# Patient Record
Sex: Female | Born: 1985 | Race: Black or African American | Hispanic: No | Marital: Single | Smoking: Never smoker
Health system: Southern US, Community
[De-identification: ages and names within clinical notes are randomized; demographics above are authoritative.]

## PROBLEM LIST (undated history)

## (undated) DIAGNOSIS — E079 Disorder of thyroid, unspecified: Secondary | ICD-10-CM

## (undated) DIAGNOSIS — Z8719 Personal history of other diseases of the digestive system: Secondary | ICD-10-CM

## (undated) DIAGNOSIS — D649 Anemia, unspecified: Secondary | ICD-10-CM

## (undated) DIAGNOSIS — I209 Angina pectoris, unspecified: Secondary | ICD-10-CM

## (undated) DIAGNOSIS — E039 Hypothyroidism, unspecified: Secondary | ICD-10-CM

## (undated) HISTORY — DX: Disorder of thyroid, unspecified: E07.9

## (undated) HISTORY — PX: NO PAST SURGERIES: SHX2092

## (undated) SURGERY — Surgical Case
Anesthesia: *Unknown

---

## 2015-07-15 ENCOUNTER — Other Ambulatory Visit (HOSPITAL_COMMUNITY)
Admission: RE | Admit: 2015-07-15 | Discharge: 2015-07-15 | Disposition: A | Discharge status: Home / Self Care | Payer: BC Managed Care – PPO | Source: Ambulatory Visit | Attending: Nurse Practitioner | Admitting: Nurse Practitioner

## 2015-07-15 DIAGNOSIS — Z113 Encounter for screening for infections with a predominantly sexual mode of transmission: Secondary | ICD-10-CM | POA: Diagnosis present

## 2015-07-15 DIAGNOSIS — Z01419 Encounter for gynecological examination (general) (routine) without abnormal findings: Secondary | ICD-10-CM | POA: Insufficient documentation

## 2017-05-24 ENCOUNTER — Other Ambulatory Visit (HOSPITAL_COMMUNITY): Payer: Self-pay | Admitting: General Surgery

## 2017-05-30 ENCOUNTER — Ambulatory Visit (HOSPITAL_COMMUNITY): Payer: BC Managed Care – PPO

## 2017-05-30 ENCOUNTER — Encounter: Payer: Self-pay | Admitting: Registered"

## 2017-05-30 ENCOUNTER — Encounter: Payer: BC Managed Care – PPO | Attending: General Surgery | Admitting: Registered"

## 2017-05-30 DIAGNOSIS — Z713 Dietary counseling and surveillance: Secondary | ICD-10-CM | POA: Insufficient documentation

## 2017-05-30 DIAGNOSIS — Z6841 Body Mass Index (BMI) 40.0 and over, adult: Secondary | ICD-10-CM | POA: Insufficient documentation

## 2017-05-30 DIAGNOSIS — E669 Obesity, unspecified: Secondary | ICD-10-CM

## 2017-05-30 NOTE — Progress Notes (Signed)
Pre-Op Assessment Visit:  Pre-Operative Sleeve Gastrectomy Surgery  Medical Nutrition Therapy:  Appt start time: 2:50  End time:  3:45  Patient was seen on 05/30/2017 for Pre-Operative Nutrition Assessment. Assessment and letter of approval [facility] coordinator on 05/30/2017.   Pt expectation of surgery: prevent health conditions common in her family such as diabetes & cardiovascular disease, more energy, find a way to eat healthier  Pt expectation of Dietitian: accountability  Start weight at [facility]: 274.8 BMI: 49.46   Pt states there are a lot of things she does not like: cauliflower, brussel sprouts, bananas, broccoli, and olives. Pt states she takes stairs while at work; used to attend [employer] but stopped due to it being expensive.   Per insurance, pt needs 0 SWL visits prior to surgery.    24 hr Dietary Recall: First Meal: sometimes skips; [employer]- cajun filet biscuit Snack: none Second Meal: chicken pot pie, green beans, apple pie, ice cream Snack: none Third Meal: skips Snack: sometimes peanut butter crunch bar Beverages: water, lemonade, sweet tea (occasionally), coffee (sometimes)  Encouraged to engage in 75 minutes of moderate physical activity including cardiovascular and weight baring weekly  Handouts given during visit include:  . Pre-Op Goals . Bariatric Surgery Protein Shakes . Vitamin and Mineral Recommendations  During the appointment today the following Pre-Op Goals were reviewed with the patient: . Maintain or lose weight as instructed by your surgeon  . Make healthy food choices . Begin to limit portion sizes . Limited concentrated sugars and fried foods . Keep fat/sugar in the single digits per serving on          food labels . Practice CHEWING your food  (aim for 30 chews per bite or until applesauce consistency) . Practice not drinking 15 minutes before, during, and 30 minutes after each  meal/snack . Avoid all carbonated beverages  . Avoid/limit caffeinated beverages  . Avoid all sugar-sweetened beverages . Consume 3 meals per day; eat every 3-5 hours . Make a list of non-food related activities . Aim for 64-100 ounces of FLUID daily  . Aim for at least 60-80 grams of PROTEIN daily . Look for a liquid protein source that contain ?15 g protein and ?5 g carbohydrate  (ex: shakes, drinks, shots) . Physical activity is an important part of a healthy lifestyle so keep it moving!  Follow diet recommendations listed below Energy and Macronutrient Recommendations: Calories: 1800 Carbohydrate: 200 Protein: 135 Fat: 50  Demonstrated degree of understanding via:  Teach Back   Teaching Method Utilized:  Visual Auditory Hands on  Barriers to learning/adherence to lifestyle change: none  Patient to call the [facility] to enroll in Pre-Op and Post-Op Nutrition Education when surgery date is scheduled.

## 2017-07-21 ENCOUNTER — Other Ambulatory Visit (HOSPITAL_COMMUNITY): Payer: BC Managed Care – PPO

## 2017-07-21 ENCOUNTER — Encounter (HOSPITAL_COMMUNITY): Payer: Self-pay

## 2017-07-21 ENCOUNTER — Ambulatory Visit (HOSPITAL_COMMUNITY): Payer: BC Managed Care – PPO

## 2017-07-27 ENCOUNTER — Ambulatory Visit (HOSPITAL_COMMUNITY)
Admission: RE | Admit: 2017-07-27 | Discharge: 2017-07-27 | Disposition: A | Discharge status: Home / Self Care | Payer: BC Managed Care – PPO | Source: Ambulatory Visit | Attending: General Surgery | Admitting: General Surgery

## 2017-07-27 ENCOUNTER — Encounter (HOSPITAL_COMMUNITY): Payer: Self-pay | Admitting: Radiology

## 2017-07-27 ENCOUNTER — Other Ambulatory Visit: Payer: Self-pay

## 2017-07-27 DIAGNOSIS — R9431 Abnormal electrocardiogram [ECG] [EKG]: Secondary | ICD-10-CM | POA: Diagnosis not present

## 2017-07-27 DIAGNOSIS — K449 Diaphragmatic hernia without obstruction or gangrene: Secondary | ICD-10-CM | POA: Diagnosis not present

## 2017-07-27 DIAGNOSIS — Z01810 Encounter for preprocedural cardiovascular examination: Secondary | ICD-10-CM | POA: Diagnosis not present

## 2017-07-27 DIAGNOSIS — K219 Gastro-esophageal reflux disease without esophagitis: Secondary | ICD-10-CM | POA: Diagnosis not present

## 2017-08-01 ENCOUNTER — Encounter: Payer: BC Managed Care – PPO | Attending: General Surgery | Admitting: Skilled Nursing Facility1

## 2017-08-01 DIAGNOSIS — Z713 Dietary counseling and surveillance: Secondary | ICD-10-CM | POA: Insufficient documentation

## 2017-08-01 DIAGNOSIS — Z6841 Body Mass Index (BMI) 40.0 and over, adult: Secondary | ICD-10-CM | POA: Diagnosis not present

## 2017-08-01 DIAGNOSIS — E669 Obesity, unspecified: Secondary | ICD-10-CM

## 2017-08-03 ENCOUNTER — Encounter: Payer: Self-pay | Admitting: Skilled Nursing Facility1

## 2017-08-03 NOTE — Progress Notes (Signed)
Pre-Operative Nutrition Class:  Appt start time: 5284   End time:  1830.  Patient was seen on 08/01/2017 for Pre-Operative Bariatric Surgery Education at [facility].   Surgery date:  Surgery type: sleeve Start weight at [facility]: 274.8 Weight today: 282.8  Samples given per MNT protocol. Patient educated on appropriate usage: Bariatric Advantage Multivitamin Lot # [id number] Exp: 01/2018  Bariatric Advantage Calcium  Lot # [id number] Exp:03-26-18  Unjury Protein Shake Lot # [id number] Exp: 04-30-18 The following the learning objectives were met by the patient during this course:  Identify Pre-Op Dietary Goals and will begin 2 weeks pre-operatively  Identify appropriate sources of fluids and proteins   State protein recommendations and appropriate sources pre and post-operatively  Identify Post-Operative Dietary Goals and will follow for 2 weeks post-operatively  Identify appropriate multivitamin and calcium sources  Describe the need for physical activity post-operatively and will follow MD recommendations  State when to call healthcare provider regarding medication questions or post-operative complications  Handouts given during class include:  Pre-Op Bariatric Surgery Diet Handout  Protein Shake Handout  Post-Op Bariatric Surgery Nutrition Handout  [facility] Program Information Flyer  Support Group Information Flyer  [facility] Bariatric Supplements Price List  Follow-Up Plan: [facility] diet advancement per MD.

## 2017-09-02 ENCOUNTER — Ambulatory Visit: Payer: Self-pay | Admitting: General Surgery

## 2017-09-07 NOTE — Patient Instructions (Addendum)
Lauren Oconnor Lauren Oconnor  09/07/2017   Your procedure is scheduled on: 09-13-17   [facility]     Report to admitting at 10:15 AM   Call this number if you have problems the morning of surgery [phone]     Remember: NO SOLID FOOD AFTER 600 PM THE NIGHT BEFORE YOUR SURGERY. YOU MAY DRINK CLEAR FLUIDS. MORNING OF SURGERY DRINK:  1SHAKE BEFORE YOU LEAVE HOME, DRINK ALL OF THE SHAKE AT ONE TIME.  THE SHAKE YOU DRINK BEFORE YOU LEAVE HOME WILL BE THE LAST FLUIDS YOU DRINK BEFORE SURGERY.   PAIN IS EXPECTED AFTER SURGERY AND WILL NOT BE COMPLETELY ELIMINATED. AMBULATION AND TYLENOL WILL HELP REDUCE INCISIONAL AND GAS PAIN. MOVEMENT IS KEY! YOU ARE EXPECTED TO BE OUT OF BED WITHIN 4 HOURS OF ADMISSION TO YOUR PATIENT ROOM. SITTING IN THE RECLINER THROUGHOUT THE DAY IS IMPORTANT FOR DRINKING FLUIDS AND MOVING GAS THROUGHOUT THE GI TRACT. COMPRESSION STOCKINGS SHOULD BE [facility] UNLESS YOU ARE WALKING.  INCENTIVE SPIROMETER SHOULD BE USED EVERY HOUR WHILE AWAKE TO DECREASE POST-OPERATIVE COMPLICATIONS SUCH AS PNEUMONIA. WHEN DISCHARGED HOME, IT IS IMPORTANT TO CONTINUE TO WALK EVERY HOUR AND USE THE INCENTIVE SPIROMETER EVERY HOUR.       CLEAR LIQUID DIET   Foods Allowed                                                                     Foods Excluded  Coffee and tea, regular and decaf                             liquids that you cannot  Plain Jell-O in any flavor                                             see through such as: Fruit ices (not with fruit pulp)                                     milk, soups, orange juice  Iced Popsicles                                    All solid food Carbonated beverages, regular and diet                                    Cranberry, grape and apple juices Sports drinks like Gatorade Lightly seasoned clear broth or consume(fat free) Sugar, honey syrup  Sample Menu Breakfast                                 Lunch  Supper Cranberry juice                    Beef broth                            Chicken broth Jell-O                                     Grape juice                           Apple juice Coffee or tea                        Jell-O                                      Popsicle                                                Coffee or tea                        Coffee or tea  _____________________________________________________________________         Take these medicines the morning of surgery with A SIP OF WATER: Levothyroxine (Synthroid)                                You may not have any metal on your body including hair pins and              piercings  Do not wear jewelry, make-up, lotions, powders or perfumes, deodorant             Do not wear nail polish.  Do not shave  48 hours prior to surgery.                Do not bring valuables to the hospital. [facility] IS NOT             RESPONSIBLE   FOR VALUABLES.  Contacts, dentures or bridgework may not be worn into surgery.  Leave suitcase in the car. After surgery it may be brought to your room.                  Please read over the following fact sheets you were given: _____________________________________________________________________          [facility] - Preparing for Surgery Before surgery, you can play an important role.  Because skin is not sterile, your skin needs to be as free of germs as possible.  You can reduce the number of germs on your skin by washing with CHG (chlorahexidine gluconate) soap before surgery.  CHG is an antiseptic cleaner which kills germs and bonds with the skin to continue killing germs even after washing. Please DO NOT use if you have an allergy to CHG or antibacterial soaps.  If your skin becomes reddened/irritated stop using the CHG and inform your nurse when you arrive at Short Stay. Do not shave (including legs and underarms)  for at least 48 hours prior to the first CHG shower.  You may shave your face/neck. Please follow these instructions carefully:  1.  Shower with CHG Soap the night before surgery and the  morning of Surgery.  2.  If you choose to wash your hair, wash your hair first as usual with your  normal  shampoo.  3.  After you shampoo, rinse your hair and body thoroughly to remove the  shampoo.                           4.  Use CHG as you would any other liquid soap.  You can apply chg directly  to the skin and wash                       Gently with a scrungie or clean washcloth.  5.  Apply the CHG Soap to your body ONLY FROM THE NECK DOWN.   Do not use on face/ open                           Wound or open sores. Avoid contact with eyes, ears mouth and genitals (private parts).                       Wash face,  Genitals (private parts) with your normal soap.             6.  Wash thoroughly, paying special attention to the area where your surgery  will be performed.  7.  Thoroughly rinse your body with warm water from the neck down.  8.  DO NOT shower/wash with your normal soap after using and rinsing off  the CHG Soap.                9.  Pat yourself dry with a clean towel.            10.  Wear clean pajamas.            11.  Place clean sheets on your bed the night of your first shower and do not  sleep with pets. Day of Surgery : Do not apply any lotions/deodorants the morning of surgery.  Please wear clean clothes to the hospital/surgery center.  FAILURE TO FOLLOW THESE INSTRUCTIONS MAY RESULT IN THE CANCELLATION OF YOUR SURGERY PATIENT SIGNATURE_________________________________  NURSE SIGNATURE__________________________________  ________________________________________________________________________  WHAT IS A BLOOD TRANSFUSION? Blood Transfusion Information  A transfusion is the replacement of blood or some of its parts. Blood is made up of multiple cells which provide different  functions.  Red blood cells carry oxygen and are used for blood loss replacement.  White blood cells fight against infection.  Platelets control bleeding.  Plasma helps clot blood.  Other blood products are available for specialized needs, such as hemophilia or other clotting disorders. BEFORE THE TRANSFUSION  Who gives blood for transfusions?   Healthy volunteers who are fully evaluated to make sure their blood is safe. This is blood bank blood. Transfusion therapy is the safest it has ever been in the practice of medicine. Before blood is taken from a donor, a complete history is taken to make sure that person has no history of diseases nor engages in risky social behavior (examples are intravenous drug use or sexual activity with multiple partners). The donor's travel history is screened to minimize risk of transmitting  infections, such as malaria. The donated blood is tested for signs of infectious diseases, such as HIV and hepatitis. The blood is then tested to be sure it is compatible with you in order to minimize the chance of a transfusion reaction. If you or a relative donates blood, this is often done in anticipation of surgery and is not appropriate for emergency situations. It takes many days to process the donated blood. RISKS AND COMPLICATIONS Although transfusion therapy is very safe and saves many lives, the main dangers of transfusion include:   Getting an infectious disease.  Developing a transfusion reaction. This is an allergic reaction to something in the blood you were given. Every precaution is taken to prevent this. The decision to have a blood transfusion has been considered carefully by your caregiver before blood is given. Blood is not given unless the benefits outweigh the risks. AFTER THE TRANSFUSION  Right after receiving a blood transfusion, you will usually feel much better and more energetic. This is especially true if your red blood cells have gotten low  (anemic). The transfusion raises the level of the red blood cells which carry oxygen, and this usually causes an energy increase.  The nurse administering the transfusion will monitor you carefully for complications. HOME CARE INSTRUCTIONS  No special instructions are needed after a transfusion. You may find your energy is better. Speak with your caregiver about any limitations on activity for underlying diseases you may have. SEEK MEDICAL CARE IF:   Your condition is not improving after your transfusion.  You develop redness or irritation at the intravenous (IV) site. SEEK IMMEDIATE MEDICAL CARE IF:  Any of the following symptoms occur over the next 12 hours:  Shaking chills.  You have a temperature by mouth above 102 F (38.9 C), not controlled by medicine.  Chest, back, or muscle pain.  People around you feel you are not acting correctly or are confused.  Shortness of breath or difficulty breathing.  Dizziness and fainting.  You get a rash or develop hives.  You have a decrease in urine output.  Your urine turns a dark color or changes to pink, red, or brown. Any of the following symptoms occur over the next 10 days:  You have a temperature by mouth above 102 F (38.9 C), not controlled by medicine.  Shortness of breath.  Weakness after normal activity.  The white part of the eye turns yellow (jaundice).  You have a decrease in the amount of urine or are urinating less often.  Your urine turns a dark color or changes to pink, red, or brown. Document Released: 07/02/2000 Document Revised: 09/27/2011 Document Reviewed: 02/19/2008 [facility] Patient Information 2014 [place], [place].  _______________________________________________________________________

## 2017-09-07 NOTE — Progress Notes (Signed)
07-27-17 (Epic) EKG, CXR

## 2017-09-08 ENCOUNTER — Ambulatory Visit: Payer: Self-pay | Admitting: General Surgery

## 2017-09-08 NOTE — H&P (Signed)
Lauren Oconnor Documented: 09/02/2017 4:54 PM Location: [facility] Patient #: [id number] DOB: 01/29/1986 Single / Language: English / Race: Black or African American Female  History of Present Illness (Dustine Stickler M. Jaylianna Tatlock MD; 09/08/2017 9:53 AM) The patient is a 31 year old female who presents for a bariatric surgery evaluation. She comes in for a follow-up appointment. She is been approved for weight loss surgery. I initially met her back in November. She denies any significant medical changes since I initially met her. She denies any trips to the emergency room hospital. Her bariatric evaluation revealed a elevated TSH level of 28. Her primary care doctor has adjusted her Synthroid medication and is monitoring that. Her upper GI showed a small hiatal hernia. Chest x-ray was unremarkable. Iron level is a little bit low at 28 but no frank anemia. Otherwise bariatric evaluation labs were unremarkable. She denies any heartburn, indigestion or vomiting. She denies any chest pain, chest pressure, shortness of breath, chest tightness.  05/20/2017 She is referred by Dr Varadarajan for evaluation of weight loss surgery. She completed our seminar on line. She is interested in the sleeve gastrectomy. She has several acquaintances who have had a lap band and believes this is not the best procedure for her. Despite several attempts were sustained weight loss she has been unsuccessful. She has tried Weight Watchers, Nutrisystem, keto diet, and dietary supplements-all without any long-term success. She has seen the long-term effects of morbid obesity with respect to her mother and is interested in preventing that for herself  She denies any chest pain, chest pressure, shortness of breath, orthopnea, paroxysmal nocturnal dyspnea, dyspnea on exertion, TIAs or amaurosis fugax. She denies any personal or family history of blood clots. She'll occasionally has some ankle edema after standing for  prolonged period of time. Her sleep apnea questionnaire was normal. She denies any heartburn, indigestion, vomiting, diarrhea or constipation. She does have some left sided flank discomfort with physical activity. She denies any hematuria or dysuria. She reports regular menses. She has bilateral knee pain with physical activity. She denies any diagnosis of diabetes. She takes medication for hypothyroidism. She denies any lightheadedness or dizziness. She denies any tobacco or alcohol use. She is a high school teacher.   Problem List/Past Medical (Mattalynn Crandle M. Darina Hartwell, MD; 09/08/2017 9:55 AM) BILATERAL CHRONIC KNEE PAIN (M25.561, M25.562) MORBID OBESITY WITH BMI OF 50.0-59.9, ADULT (E66.01) HIATAL HERNIA (K44.9)  Diagnostic Studies History (Adam Demary M. Collyns Mcquigg, MD; 09/08/2017 9:55 AM) Colonoscopy never Mammogram >3 years ago Pap Smear 1-5 years ago  Allergies (Tanisha A. Brown, RMA; 09/02/2017 4:55 PM) No Known Allergies [05/19/2017]: Allergies Reconciled  Medication History (Leesa Leifheit M. Annjanette Wertenberger, MD; 09/08/2017 9:55 AM) Iron (325 (65 Fe)MG Tablet, Oral) Active. Vitamin D (Ergocalciferol) (50000UNIT Capsule, Oral) Active. Levothyroxine Sodium (137MCG Tablet, Oral) Active. Medications Reconciled OxyCODONE HCl (5MG/5ML Solution, 5 Oral every six hours, as needed, Taken starting 09/02/2017) Active. Ondansetron (4MG Tablet Disint, 1 (one) Oral every six hours, as needed, Taken starting 09/02/2017) Active. Pantoprazole Sodium (40MG Tablet DR, 1 (one) Oral daily, Taken starting 09/02/2017) Active.  Social History (Delaine Hernandez M. Puja Caffey, MD; 09/08/2017 9:55 AM) Alcohol use Occasional alcohol use. Caffeine use Coffee. No drug use Tobacco use Never smoker.  Family History (Verdella Laidlaw M. Trevione Wert, MD; 09/08/2017 9:55 AM) Alcohol Abuse Father. Arthritis Mother. Breast Cancer Family Members In General. Diabetes Mellitus Family Members In General, Mother. Heart Disease Mother. Heart disease in  female family member before age 65 Hypertension Family Members In General. Kidney Disease Father.   Malignant Neoplasm Of Pancreas Family Members In General.  Pregnancy / Birth History (Avory Mimbs M. Jannae Fagerstrom, MD; 09/08/2017 9:55 AM) Age at menarche 11 years. Contraceptive History Oral contraceptives. Gravida 0 Para 0 Regular periods  Other Problems (Lanyah Spengler M. Aleisa Howk, MD; 09/08/2017 9:55 AM) HYPOTHYROIDISM, ADULT (E03.9)     Review of Systems (Anuoluwapo Mefferd M. Paytience Bures MD; 09/08/2017 9:54 AM) General Present- Fatigue, Weight Gain and Weight Loss. Not Present- Appetite Loss, Chills, Fever and Night Sweats. Skin Not Present- Change in Wart/Mole, Dryness, Hives, Jaundice, New Lesions, Non-Healing Wounds, Rash and Ulcer. HEENT Present- Wears glasses/contact lenses. Not Present- Earache, Hearing Loss, Hoarseness, Nose Bleed, Oral Ulcers, Ringing in the Ears, Seasonal Allergies, Sinus Pain, Sore Throat, Visual Disturbances and Yellow Eyes. Respiratory Not Present- Bloody sputum, Chronic Cough, Difficulty Breathing, Snoring and Wheezing. Breast Not Present- Breast Mass, Breast Pain, Nipple Discharge and Skin Changes. Cardiovascular Present- Swelling of Extremities. Not Present- Chest Pain, Difficulty Breathing Lying Down, Leg Cramps, Palpitations, Rapid Heart Rate and Shortness of Breath. Gastrointestinal Not Present- Abdominal Pain, Bloating, Bloody Stool, Change in Bowel Habits, Chronic diarrhea, Constipation, Difficulty Swallowing, Excessive gas, Gets full quickly at meals, Hemorrhoids, Indigestion, Nausea, Rectal Pain and Vomiting. Female Genitourinary Present- Frequency. Not Present- Nocturia, Painful Urination, Pelvic Pain and Urgency. Musculoskeletal Present- Joint Pain and Muscle Pain. Not Present- Back Pain, Joint Stiffness, Muscle Weakness and Swelling of Extremities. Neurological Not Present- Decreased Memory, Fainting, Headaches, Numbness, Seizures, Tingling, Tremor, Trouble walking and  Weakness. Psychiatric Not Present- Anxiety, Bipolar, Change in Sleep Pattern, Depression, Fearful and Frequent crying. Endocrine Not Present- Cold Intolerance, Excessive Hunger, Hair Changes, Heat Intolerance, Hot flashes and New Diabetes. Hematology Not Present- Blood Thinners, Easy Bruising, Excessive bleeding, Gland problems, HIV and Persistent Infections.  Vitals (Tanisha A. Brown RMA; 09/02/2017 4:55 PM) 09/02/2017 4:54 PM Weight: 278.6 lb Height: 62in Body Surface Area: 2.2 m Body Mass Index: 50.96 kg/m  Temp.: 98.1F  Pulse: 83 (Regular)  BP: 116/82 (Sitting, Left Arm, Standard)      Physical Exam (Latisha Lasch M. Carole Doner MD; 09/08/2017 9:54 AM)  General Mental Status-Alert. General Appearance-Consistent with stated age. Hydration-Well hydrated. Voice-Normal. Note: morbidly obese, evenly distributed  Head and Neck Head-normocephalic, atraumatic with no lesions or palpable masses. Trachea-midline. Thyroid Gland Characteristics - normal size and consistency.  Eye Eyeball - Bilateral-Extraocular movements intact. Sclera/Conjunctiva - Bilateral-No scleral icterus.  ENMT Ears -Note:normal external ears.  Mouth and Throat -Note:lips intact.   Chest and Lung Exam Chest and lung exam reveals -quiet, even and easy respiratory effort with no use of accessory muscles and on auscultation, normal breath sounds, no adventitious sounds and normal vocal resonance. Inspection Chest Wall - Normal. Back - normal.  Breast - Did not examine.  Cardiovascular Cardiovascular examination reveals -normal heart sounds, regular rate and rhythm with no murmurs and normal pedal pulses bilaterally.  Abdomen Inspection Inspection of the abdomen reveals - No Hernias. Skin - Scar - no surgical scars. Palpation/Percussion Palpation and Percussion of the abdomen reveal - Soft, Non Tender, No Rebound tenderness, No Rigidity (guarding) and No  hepatosplenomegaly. Auscultation Auscultation of the abdomen reveals - Bowel sounds normal.  Peripheral Vascular Upper Extremity Palpation - Pulses bilaterally normal.  Neurologic Neurologic evaluation reveals -alert and oriented x 3 with no impairment of recent or remote memory. Mental Status-Normal.  Neuropsychiatric The patient's mood and affect are described as -normal. Judgment and Insight-insight is appropriate concerning matters relevant to self.  Musculoskeletal Normal Exam - Left-Upper Extremity Strength Normal and Lower Extremity Strength Normal. Normal Exam -   Right-Upper Extremity Strength Normal and Lower Extremity Strength Normal. Note: L knee crepitus  Lymphatic Head & Neck  General Head & Neck Lymphatics: Bilateral - Description - Normal. Axillary - Did not examine. Femoral & Inguinal - Did not examine.    Assessment & Plan (Matther Labell M. Emry Barbato MD; 09/08/2017 9:55 AM)  MORBID OBESITY WITH BMI OF 50.0-59.9, ADULT (E66.01) Impression: We reviewed her preoperative workup. We discussed the finding of a hiatal hernia. Please see below. We rediscussed the typical hospitalization as well as the typical recovery course. All of her questions were asked and answered. I think we can proceed with laparoscopic sleeve gastrectomy with possible hiatal hernia repair. We discussed the importance of preoperative meal plan. She was given her postoperative prescriptions today. She has attended her preoperative education class  Current Plans Started OxyCODONE HCl 5 MG/5ML Oral Solution, 5 Milliliter every six hours, as needed, 75 Milliliter, 09/02/2017, No Refill. Started Ondansetron 4 MG Oral Tablet Disintegrating, 1 (one) Tablet every six hours, as needed, #20, 09/02/2017, No Refill. Started Pantoprazole Sodium 40 MG Oral Tablet Delayed Release, 1 (one) Tablet daily, #30, 09/02/2017, No Refill. Pt Education - EMW_preopbariatric  BILATERAL CHRONIC KNEE PAIN  (M25.561)   HYPOTHYROIDISM, ADULT (E03.9)   HIATAL HERNIA (K44.9) Impression: We discussed the findings of a small hiatal hernia on upper GI. We discussed hiatal hernia. We discussed that I would tests were one intraoperatively. If found to have 1 I recommended repair. We discussed that I would involve suturing the left and right crura of the diaphragm back together.  Washington Whedbee M. Li Fragoso, MD, FACS General, Bariatric, & Minimally Invasive Surgery [facility] [place]  

## 2017-09-08 NOTE — H&P (View-Only) (Signed)
Lauren Oconnor Documented: 09/02/2017 4:54 PM Location: [facility] Patient #: [id number] DOB: 1986-04-20 Single / Language: Lauren Oconnor / Race: Black or African American Female  History of Present Illness Lauren Oconnor M. Lauren Beutler MD; 09/08/2017 9:53 AM) The patient is a 32 year old female who presents for a bariatric surgery evaluation. She comes in for a follow-up appointment. She is been approved for weight loss surgery. I initially met her back in November. She denies any significant medical changes since I initially met her. She denies any trips to the emergency room hospital. Her bariatric evaluation revealed a elevated TSH level of 28. Her primary care doctor has adjusted her Synthroid medication and is monitoring that. Her upper GI showed a small hiatal hernia. Chest x-ray was unremarkable. Iron level is a little bit low at 28 but no frank anemia. Otherwise bariatric evaluation labs were unremarkable. She denies any heartburn, indigestion or vomiting. She denies any chest pain, chest pressure, shortness of breath, chest tightness.  05/20/2017 She is referred by Dr Lauren Oconnor for evaluation of weight loss surgery. She completed our seminar on line. She is interested in the sleeve gastrectomy. She has several acquaintances who have had a lap band and believes this is not the best procedure for her. Despite several attempts were sustained weight loss she has been unsuccessful. She has tried Weight Watchers, Nutrisystem, keto diet, and dietary supplements-all without any long-term success. She has seen the long-term effects of morbid obesity with respect to her mother and is interested in preventing that for herself  She denies any chest pain, chest pressure, shortness of breath, orthopnea, paroxysmal nocturnal dyspnea, dyspnea on exertion, TIAs or amaurosis fugax. She denies any personal or family history of blood clots. She'll occasionally has some ankle edema after standing for  prolonged period of time. Her sleep apnea questionnaire was normal. She denies any heartburn, indigestion, vomiting, diarrhea or constipation. She does have some left sided flank discomfort with physical activity. She denies any hematuria or dysuria. She reports regular menses. She has bilateral knee pain with physical activity. She denies any diagnosis of diabetes. She takes medication for hypothyroidism. She denies any lightheadedness or dizziness. She denies any tobacco or alcohol use. She is a Public relations account executive.   Problem List/Past Medical Lauren Oconnor M. Lauren Pulling, MD; 09/08/2017 9:55 AM) BILATERAL CHRONIC KNEE PAIN (M25.561, M25.562) MORBID OBESITY WITH BMI OF 50.0-59.9, ADULT (E66.01) HIATAL HERNIA (K44.9)  Diagnostic Studies History (Lauren Oconnor M. Lauren Pulling, MD; 09/08/2017 9:55 AM) Colonoscopy never Mammogram >3 years ago Pap Smear 1-5 years ago  Allergies (Lauren Oconnor, Normal; 09/02/2017 4:55 PM) No Known Allergies [05/19/2017]: Allergies Reconciled  Medication History Lauren Oconnor M. Lauren Pulling, MD; 09/08/2017 9:55 AM) Iron (325 (65 Fe)MG Tablet, Oral) Active. Vitamin D (Ergocalciferol) (50000UNIT Capsule, Oral) Active. Levothyroxine Sodium (137MCG Tablet, Oral) Active. Medications Reconciled OxyCODONE HCl (5MG/5ML Solution, 5 Oral every six hours, as needed, Taken starting 09/02/2017) Active. Ondansetron (4MG Tablet Disint, 1 (one) Oral every six hours, as needed, Taken starting 09/02/2017) Active. Pantoprazole Sodium (40MG Tablet DR, 1 (one) Oral daily, Taken starting 09/02/2017) Active.  Social History Lauren Oconnor M. Lauren Pulling, MD; 09/08/2017 9:55 AM) Alcohol use Occasional alcohol use. Caffeine use Coffee. No drug use Tobacco use Never smoker.  Family History Lauren Oconnor M. Lauren Pulling, MD; 09/08/2017 9:55 AM) Alcohol Abuse Father. Arthritis Mother. Breast Cancer Family Members In General. Diabetes Mellitus Family Members In General, Mother. Heart Disease Mother. Heart disease in  female family member before age 40 Hypertension Family Members In General. Kidney Disease Father.  Malignant Neoplasm Of Pancreas Family Members In General.  Pregnancy / Birth History Lauren Oconnor. Lauren Pulling, MD; 09/08/2017 9:55 AM) Age at menarche 67 years. Contraceptive History Oral contraceptives. Gravida 0 Para 0 Regular periods  Other Problems Lauren Oconnor M. Lauren Pulling, MD; 09/08/2017 9:55 AM) HYPOTHYROIDISM, ADULT (E03.9)     Review of Systems Lauren Oconnor M. Lauren Virella MD; 09/08/2017 9:54 AM) General Present- Fatigue, Weight Gain and Weight Loss. Not Present- Appetite Loss, Chills, Fever and Night Sweats. Skin Not Present- Change in Wart/Mole, Dryness, Hives, Jaundice, New Lesions, Non-Healing Wounds, Rash and Ulcer. HEENT Present- Wears glasses/contact lenses. Not Present- Earache, Hearing Loss, Hoarseness, Nose Bleed, Oral Ulcers, Ringing in the Ears, Seasonal Allergies, Sinus Pain, Sore Throat, Visual Disturbances and Yellow Eyes. Respiratory Not Present- Bloody sputum, Chronic Cough, Difficulty Breathing, Snoring and Wheezing. Breast Not Present- Breast Mass, Breast Pain, Nipple Discharge and Skin Changes. Cardiovascular Present- Swelling of Extremities. Not Present- Chest Pain, Difficulty Breathing Lying Down, Leg Cramps, Palpitations, Rapid Heart Rate and Shortness of Breath. Gastrointestinal Not Present- Abdominal Pain, Bloating, Bloody Stool, Change in Bowel Habits, Chronic diarrhea, Constipation, Difficulty Swallowing, Excessive gas, Gets full quickly at meals, Hemorrhoids, Indigestion, Nausea, Rectal Pain and Vomiting. Female Genitourinary Present- Frequency. Not Present- Nocturia, Painful Urination, Pelvic Pain and Urgency. Musculoskeletal Present- Joint Pain and Muscle Pain. Not Present- Back Pain, Joint Stiffness, Muscle Weakness and Swelling of Extremities. Neurological Not Present- Decreased Memory, Fainting, Headaches, Numbness, Seizures, Tingling, Tremor, Trouble walking and  Weakness. Psychiatric Not Present- Anxiety, Bipolar, Change in Sleep Pattern, Depression, Fearful and Frequent crying. Endocrine Not Present- Cold Intolerance, Excessive Hunger, Hair Changes, Heat Intolerance, Hot flashes and New Diabetes. Hematology Not Present- Blood Thinners, Easy Bruising, Excessive bleeding, Gland problems, HIV and Persistent Infections.  Vitals (Lauren A. Brown RMA; 09/02/2017 4:55 PM) 09/02/2017 4:54 PM Weight: 278.6 lb Height: 62in Body Surface Area: 2.2 m Body Mass Index: 50.96 kg/m  Temp.: 98.2F  Pulse: 83 (Regular)  BP: 116/82 (Sitting, Left Arm, Standard)      Physical Exam Lauren Oconnor M. Zeppelin Commisso MD; 09/08/2017 9:54 AM)  General Mental Status-Alert. General Appearance-Consistent with stated age. Hydration-Well hydrated. Voice-Normal. Note: morbidly obese, evenly distributed  Head and Neck Head-normocephalic, atraumatic with no lesions or palpable masses. Trachea-midline. Thyroid Gland Characteristics - normal size and consistency.  Eye Eyeball - Bilateral-Extraocular movements intact. Sclera/Conjunctiva - Bilateral-No scleral icterus.  ENMT Ears -Note:normal external ears.  Mouth and Throat -Note:lips intact.   Chest and Lung Exam Chest and lung exam reveals -quiet, even and easy respiratory effort with no use of accessory muscles and on auscultation, normal breath sounds, no adventitious sounds and normal vocal resonance. Inspection Chest Wall - Normal. Back - normal.  Breast - Did not examine.  Cardiovascular Cardiovascular examination reveals -normal heart sounds, regular rate and rhythm with no murmurs and normal pedal pulses bilaterally.  Abdomen Inspection Inspection of the abdomen reveals - No Hernias. Skin - Scar - no surgical scars. Palpation/Percussion Palpation and Percussion of the abdomen reveal - Soft, Non Tender, No Rebound tenderness, No Rigidity (guarding) and No  hepatosplenomegaly. Auscultation Auscultation of the abdomen reveals - Bowel sounds normal.  Peripheral Vascular Upper Extremity Palpation - Pulses bilaterally normal.  Neurologic Neurologic evaluation reveals -alert and oriented x 3 with no impairment of recent or remote memory. Mental Status-Normal.  Neuropsychiatric The patient's mood and affect are described as -normal. Judgment and Insight-insight is appropriate concerning matters relevant to self.  Musculoskeletal Normal Exam - Left-Upper Extremity Strength Normal and Lower Extremity Strength Normal. Normal Exam -  Right-Upper Extremity Strength Normal and Lower Extremity Strength Normal. Note: L knee crepitus  Lymphatic Head & Neck  General Head & Neck Lymphatics: Bilateral - Description - Normal. Axillary - Did not examine. Femoral & Inguinal - Did not examine.    Assessment & Plan Lauren Oconnor M. Brynda Heick MD; 09/08/2017 9:55 AM)  MORBID OBESITY WITH BMI OF 50.0-59.9, ADULT (E66.01) Impression: We reviewed her preoperative workup. We discussed the finding of a hiatal hernia. Please see below. We rediscussed the typical hospitalization as well as the typical recovery course. All of her questions were asked and answered. I think we can proceed with laparoscopic sleeve gastrectomy with possible hiatal hernia repair. We discussed the importance of preoperative meal plan. She was given her postoperative prescriptions today. She has attended her preoperative education class  Current Plans Started OxyCODONE HCl 5 MG/5ML Oral Solution, 5 Milliliter every six hours, as needed, 75 Milliliter, 09/02/2017, No Refill. Started Ondansetron 4 MG Oral Tablet Disintegrating, 1 (one) Tablet every six hours, as needed, #20, 09/02/2017, No Refill. Started Pantoprazole Sodium 40 MG Oral Tablet Delayed Release, 1 (one) Tablet daily, #30, 09/02/2017, No Refill. Pt Education - EMW_preopbariatric  BILATERAL CHRONIC KNEE PAIN  (M25.561)   HYPOTHYROIDISM, ADULT (E03.9)   HIATAL HERNIA (K44.9) Impression: We discussed the findings of a small hiatal hernia on upper GI. We discussed hiatal hernia. We discussed that I would tests were one intraoperatively. If found to have 1 I recommended repair. We discussed that I would involve suturing the left and right crura of the diaphragm back together.  Lauren Oconnor. Lauren Pulling, MD, FACS General, Bariatric, & Minimally Invasive Surgery [facility], [place]

## 2017-09-09 ENCOUNTER — Encounter (HOSPITAL_COMMUNITY): Payer: Self-pay

## 2017-09-09 ENCOUNTER — Other Ambulatory Visit: Payer: Self-pay

## 2017-09-09 ENCOUNTER — Encounter (HOSPITAL_COMMUNITY)
Admission: RE | Admit: 2017-09-09 | Discharge: 2017-09-09 | Disposition: A | Discharge status: Home / Self Care | Payer: BC Managed Care – PPO | Source: Ambulatory Visit | Attending: General Surgery | Admitting: General Surgery

## 2017-09-09 DIAGNOSIS — Z01812 Encounter for preprocedural laboratory examination: Secondary | ICD-10-CM | POA: Insufficient documentation

## 2017-09-09 DIAGNOSIS — Z6841 Body Mass Index (BMI) 40.0 and over, adult: Secondary | ICD-10-CM | POA: Diagnosis not present

## 2017-09-09 DIAGNOSIS — Z0183 Encounter for blood typing: Secondary | ICD-10-CM | POA: Diagnosis not present

## 2017-09-09 HISTORY — DX: Personal history of other diseases of the digestive system: Z87.19

## 2017-09-09 HISTORY — DX: Hypothyroidism, unspecified: E03.9

## 2017-09-09 HISTORY — DX: Angina pectoris, unspecified: I20.9

## 2017-09-09 HISTORY — DX: Anemia, unspecified: D64.9

## 2017-09-09 LAB — COMPREHENSIVE METABOLIC PANEL
ALBUMIN: 3.9 g/dL (ref 3.5–5.0)
ALT: 16 U/L (ref 14–54)
ANION GAP: 12 (ref 5–15)
AST: 24 U/L (ref 15–41)
Alkaline Phosphatase: 70 U/L (ref 38–126)
BILIRUBIN TOTAL: 0.5 mg/dL (ref 0.3–1.2)
BUN: 15 mg/dL (ref 6–20)
CO2: 22 mmol/L (ref 22–32)
Calcium: 9.2 mg/dL (ref 8.9–10.3)
Chloride: 105 mmol/L (ref 101–111)
Creatinine, Ser: 0.92 mg/dL (ref 0.44–1.00)
GFR calc Af Amer: >60 mL/min (ref >60)
GFR calc non Af Amer: >60 mL/min (ref >60)
GLUCOSE: 85 mg/dL (ref 65–99)
POTASSIUM: 4 mmol/L (ref 3.5–5.1)
SODIUM: 139 mmol/L (ref 135–145)
TOTAL PROTEIN: 8.1 g/dL (ref 6.5–8.1)

## 2017-09-09 LAB — CBC WITH DIFFERENTIAL/PLATELET
BASOS PCT: 0 %
Basophils Absolute: 0 10*3/uL (ref 0.0–0.1)
EOS ABS: 0.1 10*3/uL (ref 0.0–0.7)
Eosinophils Relative: 1 %
HEMATOCRIT: 37.9 % (ref 36.0–46.0)
HEMOGLOBIN: 12.4 g/dL (ref 12.0–15.0)
Lymphocytes Relative: 35 %
Lymphs Abs: 2.3 10*3/uL (ref 0.7–4.0)
MCH: 24.7 pg — ABNORMAL LOW (ref 26.0–34.0)
MCHC: 32.7 g/dL (ref 30.0–36.0)
MCV: 75.5 fL — ABNORMAL LOW (ref 78.0–100.0)
Monocytes Absolute: 0.5 10*3/uL (ref 0.1–1.0)
Monocytes Relative: 8 %
NEUTROS ABS: 3.8 10*3/uL (ref 1.7–7.7)
NEUTROS PCT: 56 %
Platelets: 260 10*3/uL (ref 150–400)
RBC: 5.02 MIL/uL (ref 3.87–5.11)
RDW: 14.8 % (ref 11.5–15.5)
WBC: 6.8 10*3/uL (ref 4.0–10.5)

## 2017-09-10 LAB — ABO/RH: ABO/RH(D): O POS

## 2017-09-13 ENCOUNTER — Inpatient Hospital Stay (HOSPITAL_COMMUNITY): Payer: BC Managed Care – PPO | Admitting: Anesthesiology

## 2017-09-13 ENCOUNTER — Inpatient Hospital Stay (HOSPITAL_COMMUNITY)
Admission: RE | Admit: 2017-09-13 | Discharge: 2017-09-15 | DRG: 621 | Disposition: A | Discharge status: Home / Self Care | Payer: BC Managed Care – PPO | Source: Ambulatory Visit | Attending: General Surgery | Admitting: General Surgery

## 2017-09-13 ENCOUNTER — Encounter (HOSPITAL_COMMUNITY): Payer: Self-pay | Admitting: Anesthesiology

## 2017-09-13 ENCOUNTER — Other Ambulatory Visit: Payer: Self-pay

## 2017-09-13 ENCOUNTER — Encounter (HOSPITAL_COMMUNITY)
Admission: RE | Disposition: A | Discharge status: Home / Self Care | Payer: Self-pay | Source: Ambulatory Visit | Attending: General Surgery

## 2017-09-13 DIAGNOSIS — G8929 Other chronic pain: Secondary | ICD-10-CM | POA: Diagnosis present

## 2017-09-13 DIAGNOSIS — K449 Diaphragmatic hernia without obstruction or gangrene: Secondary | ICD-10-CM | POA: Diagnosis present

## 2017-09-13 DIAGNOSIS — Z79890 Hormone replacement therapy (postmenopausal): Secondary | ICD-10-CM

## 2017-09-13 DIAGNOSIS — Z9884 Bariatric surgery status: Secondary | ICD-10-CM

## 2017-09-13 DIAGNOSIS — Z800 Family history of malignant neoplasm of digestive organs: Secondary | ICD-10-CM | POA: Diagnosis not present

## 2017-09-13 DIAGNOSIS — E039 Hypothyroidism, unspecified: Secondary | ICD-10-CM | POA: Diagnosis present

## 2017-09-13 DIAGNOSIS — M25562 Pain in left knee: Secondary | ICD-10-CM | POA: Diagnosis present

## 2017-09-13 DIAGNOSIS — M25561 Pain in right knee: Secondary | ICD-10-CM | POA: Diagnosis present

## 2017-09-13 DIAGNOSIS — Z6841 Body Mass Index (BMI) 40.0 and over, adult: Secondary | ICD-10-CM

## 2017-09-13 DIAGNOSIS — Z803 Family history of malignant neoplasm of breast: Secondary | ICD-10-CM

## 2017-09-13 HISTORY — PX: LAPAROSCOPIC GASTRIC SLEEVE RESECTION: SHX5895

## 2017-09-13 LAB — TYPE AND SCREEN
ABO/RH(D): O POS
Antibody Screen: NEGATIVE

## 2017-09-13 LAB — PREGNANCY, URINE: PREG TEST UR: NEGATIVE

## 2017-09-13 LAB — HEMOGLOBIN AND HEMATOCRIT, BLOOD
HCT: 37.1 % (ref 36.0–46.0)
Hemoglobin: 12.1 g/dL (ref 12.0–15.0)

## 2017-09-13 SURGERY — GASTRECTOMY, SLEEVE, LAPAROSCOPIC
Anesthesia: General | Site: Abdomen

## 2017-09-13 MED ORDER — BUPIVACAINE LIPOSOME 1.3 % IJ SUSP
INTRAMUSCULAR | Status: DC | PRN
  Administered 2017-09-13: 20 mL

## 2017-09-13 MED ORDER — MIDAZOLAM HCL 5 MG/5ML IJ SOLN
INTRAMUSCULAR | Status: DC | PRN
  Administered 2017-09-13: 2 mg via INTRAVENOUS

## 2017-09-13 MED ORDER — DEXAMETHASONE SODIUM PHOSPHATE 10 MG/ML IJ SOLN
INTRAMUSCULAR | Status: DC | PRN
  Administered 2017-09-13: 10 mg via INTRAVENOUS

## 2017-09-13 MED ORDER — LACTATED RINGERS IV SOLN
INTRAVENOUS | Status: DC
  Administered 2017-09-13 (×3): via INTRAVENOUS

## 2017-09-13 MED ORDER — SUGAMMADEX SODIUM 500 MG/5ML IV SOLN
INTRAVENOUS | Status: DC | PRN
  Administered 2017-09-13: 500 mg via INTRAVENOUS

## 2017-09-13 MED ORDER — BUPIVACAINE LIPOSOME 1.3 % IJ SUSP
20.0000 mL | Freq: Once | INTRAMUSCULAR | Status: DC
  Filled 2017-09-13: qty 20

## 2017-09-13 MED ORDER — DIPHENHYDRAMINE HCL 50 MG/ML IJ SOLN: 12.5000 mg | Freq: Three times a day (TID) | INTRAMUSCULAR | Status: DC | PRN

## 2017-09-13 MED ORDER — SODIUM CHLORIDE 0.9 % IJ SOLN
INTRAMUSCULAR | Status: DC | PRN
  Administered 2017-09-13: 50 mL via INTRAVENOUS

## 2017-09-13 MED ORDER — PHENYLEPHRINE 40 MCG/ML (10ML) SYRINGE FOR IV PUSH (FOR BLOOD PRESSURE SUPPORT)
PREFILLED_SYRINGE | INTRAVENOUS | Status: DC | PRN
  Administered 2017-09-13 (×2): 120 ug via INTRAVENOUS
  Administered 2017-09-13 (×3): 80 ug via INTRAVENOUS

## 2017-09-13 MED ORDER — PROPOFOL 10 MG/ML IV BOLUS
INTRAVENOUS | Status: DC | PRN
  Administered 2017-09-13: 200 mg via INTRAVENOUS

## 2017-09-13 MED ORDER — GABAPENTIN 250 MG/5ML PO SOLN
200.0000 mg | Freq: Two times a day (BID) | ORAL | Status: DC
  Administered 2017-09-13 – 2017-09-15 (×4): 200 mg via ORAL
  Filled 2017-09-13 (×4): qty 4

## 2017-09-13 MED ORDER — LIDOCAINE 2% (20 MG/ML) 5 ML SYRINGE
INTRAMUSCULAR | Status: DC | PRN
  Administered 2017-09-13: 100 mg via INTRAVENOUS

## 2017-09-13 MED ORDER — ENOXAPARIN SODIUM 30 MG/0.3ML ~~LOC~~ SOLN
30.0000 mg | Freq: Two times a day (BID) | SUBCUTANEOUS | Status: DC
  Administered 2017-09-13 – 2017-09-15 (×4): 30 mg via SUBCUTANEOUS
  Filled 2017-09-13 (×4): qty 0.3

## 2017-09-13 MED ORDER — ONDANSETRON HCL 4 MG/2ML IJ SOLN
INTRAMUSCULAR | Status: DC | PRN
  Administered 2017-09-13: 4 mg via INTRAVENOUS

## 2017-09-13 MED ORDER — MORPHINE SULFATE (PF) 2 MG/ML IV SOLN
1.0000 mg | INTRAVENOUS | Status: DC | PRN
  Administered 2017-09-13 – 2017-09-14 (×2): 2 mg via INTRAVENOUS
  Filled 2017-09-13 (×2): qty 1

## 2017-09-13 MED ORDER — SODIUM CHLORIDE 0.9 % IV SOLN
INTRAVENOUS | Status: DC | PRN
  Administered 2017-09-13: 2 g via INTRAVENOUS

## 2017-09-13 MED ORDER — CEFOTETAN DISODIUM-DEXTROSE 2-2.08 GM-%(50ML) IV SOLR
2.0000 g | INTRAVENOUS | Status: DC
  Filled 2017-09-13: qty 50

## 2017-09-13 MED ORDER — MIDAZOLAM HCL 2 MG/2ML IJ SOLN
INTRAMUSCULAR | Status: AC
  Filled 2017-09-13: qty 2

## 2017-09-13 MED ORDER — FENTANYL CITRATE (PF) 100 MCG/2ML IJ SOLN
INTRAMUSCULAR | Status: AC
  Filled 2017-09-13: qty 2

## 2017-09-13 MED ORDER — CHLORHEXIDINE GLUCONATE 4 % EX LIQD
60.0000 mL | Freq: Once | CUTANEOUS | Status: AC
  Administered 2017-09-13: 4 via TOPICAL

## 2017-09-13 MED ORDER — SCOPOLAMINE 1 MG/3DAYS TD PT72
1.0000 | MEDICATED_PATCH | TRANSDERMAL | Status: DC
  Administered 2017-09-13: 1.5 mg via TRANSDERMAL
  Filled 2017-09-13: qty 1

## 2017-09-13 MED ORDER — ONDANSETRON HCL 4 MG/2ML IJ SOLN
4.0000 mg | Freq: Four times a day (QID) | INTRAMUSCULAR | Status: DC | PRN
  Administered 2017-09-13 – 2017-09-14 (×3): 4 mg via INTRAVENOUS
  Filled 2017-09-13 (×3): qty 2

## 2017-09-13 MED ORDER — GABAPENTIN 300 MG PO CAPS
300.0000 mg | ORAL_CAPSULE | ORAL | Status: AC
  Administered 2017-09-13: 300 mg via ORAL
  Filled 2017-09-13: qty 1

## 2017-09-13 MED ORDER — ACETAMINOPHEN 160 MG/5ML PO SOLN
650.0000 mg | Freq: Four times a day (QID) | ORAL | Status: DC
  Administered 2017-09-13 – 2017-09-14 (×2): 650 mg via ORAL
  Filled 2017-09-13 (×5): qty 20.3

## 2017-09-13 MED ORDER — FENTANYL CITRATE (PF) 100 MCG/2ML IJ SOLN
INTRAMUSCULAR | Status: DC | PRN
  Administered 2017-09-13 (×4): 50 ug via INTRAVENOUS

## 2017-09-13 MED ORDER — APREPITANT 40 MG PO CAPS
40.0000 mg | ORAL_CAPSULE | ORAL | Status: AC
  Administered 2017-09-13: 40 mg via ORAL
  Filled 2017-09-13: qty 1

## 2017-09-13 MED ORDER — KETAMINE HCL 10 MG/ML IJ SOLN
INTRAMUSCULAR | Status: DC | PRN
  Administered 2017-09-13 (×3): 10 mg via INTRAVENOUS
  Administered 2017-09-13: 20 mg via INTRAVENOUS
  Administered 2017-09-13 (×2): 10 mg via INTRAVENOUS

## 2017-09-13 MED ORDER — PROMETHAZINE HCL 25 MG/ML IJ SOLN
INTRAMUSCULAR | Status: AC
  Filled 2017-09-13: qty 1

## 2017-09-13 MED ORDER — PREMIER PROTEIN SHAKE
2.0000 [oz_av] | ORAL | Status: DC
  Administered 2017-09-14 – 2017-09-15 (×3): 2 [oz_av] via ORAL

## 2017-09-13 MED ORDER — SIMETHICONE 80 MG PO CHEW: 80.0000 mg | CHEWABLE_TABLET | Freq: Four times a day (QID) | ORAL | Status: DC | PRN

## 2017-09-13 MED ORDER — LIDOCAINE 2% (20 MG/ML) 5 ML SYRINGE
INTRAMUSCULAR | Status: DC | PRN
  Administered 2017-09-13: 1.5 mg/kg/h via INTRAVENOUS

## 2017-09-13 MED ORDER — PROMETHAZINE HCL 25 MG/ML IJ SOLN: 12.5000 mg | Freq: Four times a day (QID) | INTRAMUSCULAR | Status: DC | PRN

## 2017-09-13 MED ORDER — KETOROLAC TROMETHAMINE 30 MG/ML IJ SOLN
INTRAMUSCULAR | Status: AC
  Filled 2017-09-13: qty 1

## 2017-09-13 MED ORDER — PROMETHAZINE HCL 25 MG/ML IJ SOLN
12.5000 mg | Freq: Four times a day (QID) | INTRAMUSCULAR | Status: DC | PRN
  Administered 2017-09-13: 12.5 mg via INTRAVENOUS
  Filled 2017-09-13: qty 1

## 2017-09-13 MED ORDER — OXYCODONE HCL 5 MG PO TABS: 5.0000 mg | ORAL_TABLET | Freq: Once | ORAL | Status: DC | PRN

## 2017-09-13 MED ORDER — DEXAMETHASONE SODIUM PHOSPHATE 4 MG/ML IJ SOLN: 4.0000 mg | INTRAMUSCULAR | Status: DC

## 2017-09-13 MED ORDER — 0.9 % SODIUM CHLORIDE (POUR BTL) OPTIME
TOPICAL | Status: DC | PRN
  Administered 2017-09-13: 1000 mL

## 2017-09-13 MED ORDER — ACETAMINOPHEN 500 MG PO TABS
1000.0000 mg | ORAL_TABLET | ORAL | Status: AC
  Administered 2017-09-13: 1000 mg via ORAL
  Filled 2017-09-13: qty 2

## 2017-09-13 MED ORDER — SODIUM CHLORIDE 0.9 % IJ SOLN
INTRAMUSCULAR | Status: AC
  Filled 2017-09-13: qty 50

## 2017-09-13 MED ORDER — LACTATED RINGERS IR SOLN
Status: DC | PRN
  Administered 2017-09-13: 1000 mL

## 2017-09-13 MED ORDER — OXYCODONE HCL 5 MG/5ML PO SOLN
5.0000 mg | ORAL | Status: DC | PRN
  Administered 2017-09-15: 5 mg via ORAL
  Filled 2017-09-13: qty 5

## 2017-09-13 MED ORDER — ROCURONIUM BROMIDE 10 MG/ML (PF) SYRINGE
PREFILLED_SYRINGE | INTRAVENOUS | Status: DC | PRN
  Administered 2017-09-13: 70 mg via INTRAVENOUS

## 2017-09-13 MED ORDER — KCL IN DEXTROSE-NACL 20-5-0.45 MEQ/L-%-% IV SOLN
INTRAVENOUS | Status: DC
  Administered 2017-09-13 – 2017-09-15 (×4): via INTRAVENOUS
  Filled 2017-09-13 (×6): qty 1000

## 2017-09-13 MED ORDER — KETOROLAC TROMETHAMINE 30 MG/ML IJ SOLN
30.0000 mg | Freq: Once | INTRAMUSCULAR | Status: DC | PRN
  Administered 2017-09-13: 30 mg via INTRAVENOUS

## 2017-09-13 MED ORDER — OXYCODONE HCL 5 MG/5ML PO SOLN: 5.0000 mg | Freq: Once | ORAL | Status: DC | PRN

## 2017-09-13 MED ORDER — PROPOFOL 10 MG/ML IV BOLUS
INTRAVENOUS | Status: AC
  Filled 2017-09-13: qty 20

## 2017-09-13 MED ORDER — PROMETHAZINE HCL 25 MG/ML IJ SOLN
6.2500 mg | INTRAMUSCULAR | Status: DC | PRN
  Administered 2017-09-13: 12.5 mg via INTRAVENOUS

## 2017-09-13 MED ORDER — LIP MEDEX EX OINT
TOPICAL_OINTMENT | CUTANEOUS | Status: AC
  Filled 2017-09-13: qty 7

## 2017-09-13 MED ORDER — PANTOPRAZOLE SODIUM 40 MG IV SOLR
40.0000 mg | Freq: Every day | INTRAVENOUS | Status: DC
  Administered 2017-09-13 – 2017-09-14 (×2): 40 mg via INTRAVENOUS
  Filled 2017-09-13 (×2): qty 40

## 2017-09-13 MED ORDER — FENTANYL CITRATE (PF) 100 MCG/2ML IJ SOLN
25.0000 ug | INTRAMUSCULAR | Status: DC | PRN
  Administered 2017-09-13: 50 ug via INTRAVENOUS

## 2017-09-13 MED ORDER — HEPARIN SODIUM (PORCINE) 5000 UNIT/ML IJ SOLN
5000.0000 [IU] | INTRAMUSCULAR | Status: AC
  Administered 2017-09-13: 5000 [IU] via SUBCUTANEOUS
  Filled 2017-09-13: qty 1

## 2017-09-13 SURGICAL SUPPLY — 68 items
APPLICATOR COTTON TIP 6IN STRL (MISCELLANEOUS) IMPLANT
APPLIER CLIP ROT 10 11.4 M/L (STAPLE)
APPLIER CLIP ROT 13.4 12 LRG (CLIP)
BANDAGE ADH SHEER 1  50/CT (GAUZE/BANDAGES/DRESSINGS) ×3 IMPLANT
BENZOIN TINCTURE PRP APPL 2/3 (GAUZE/BANDAGES/DRESSINGS) ×3 IMPLANT
BLADE SURG SZ11 CARB STEEL (BLADE) ×3 IMPLANT
CABLE HIGH FREQUENCY MONO STRZ (ELECTRODE) ×3 IMPLANT
CHLORAPREP W/TINT 26ML (MISCELLANEOUS) ×6 IMPLANT
CLIP APPLIE ROT 10 11.4 M/L (STAPLE) IMPLANT
CLIP APPLIE ROT 13.4 12 LRG (CLIP) IMPLANT
CLOSURE WOUND 1/2 X4 (GAUZE/BANDAGES/DRESSINGS) ×1
DECANTER SPIKE VIAL GLASS SM (MISCELLANEOUS) ×3 IMPLANT
DEVICE SUT QUICK LOAD TK 5 (STAPLE) ×4 IMPLANT
DEVICE SUT TI-KNOT TK 5X26 (MISCELLANEOUS) ×2 IMPLANT
DEVICE SUTURE ENDOST 10MM (ENDOMECHANICALS) ×3 IMPLANT
DEVICE TI KNOT TK5 (MISCELLANEOUS) ×1
DRAPE UTILITY XL STRL (DRAPES) ×6 IMPLANT
ELECT L-HOOK LAP 45CM DISP (ELECTROSURGICAL)
ELECT PENCIL ROCKER SW 15FT (MISCELLANEOUS) IMPLANT
ELECT REM PT RETURN 15FT ADLT (MISCELLANEOUS) ×3 IMPLANT
ELECTRODE L-HOOK LAP 45CM DISP (ELECTROSURGICAL) IMPLANT
GAUZE SPONGE 2X2 8PLY STRL LF (GAUZE/BANDAGES/DRESSINGS) IMPLANT
GAUZE SPONGE 4X4 12PLY STRL (GAUZE/BANDAGES/DRESSINGS) IMPLANT
GLOVE BIO SURGEON STRL SZ7.5 (GLOVE) ×3 IMPLANT
GLOVE INDICATOR 8.0 STRL GRN (GLOVE) ×3 IMPLANT
GOWN STRL REUS W/TWL XL LVL3 (GOWN DISPOSABLE) ×15 IMPLANT
GRASPER SUT TROCAR 14GX15 (MISCELLANEOUS) ×3 IMPLANT
HOVERMATT SINGLE USE (MISCELLANEOUS) ×3 IMPLANT
KIT BASIN OR (CUSTOM PROCEDURE TRAY) ×3 IMPLANT
MARKER SKIN DUAL TIP RULER LAB (MISCELLANEOUS) ×3 IMPLANT
NEEDLE SPNL 22GX3.5 QUINCKE BK (NEEDLE) ×3 IMPLANT
PACK UNIVERSAL I (CUSTOM PROCEDURE TRAY) ×3 IMPLANT
QUICK LOAD TK 5 (STAPLE) ×2
RELOAD ENDO STITCH 2.0 (ENDOMECHANICALS) ×2
RELOAD STAPLER BLUE 60MM (STAPLE) ×1 IMPLANT
RELOAD STAPLER GOLD 60MM (STAPLE) ×1 IMPLANT
RELOAD STAPLER GREEN 60MM (STAPLE) ×1 IMPLANT
SCISSORS LAP 5X45 EPIX DISP (ENDOMECHANICALS) IMPLANT
SEALANT SURGICAL APPL DUAL CAN (MISCELLANEOUS) IMPLANT
SET IRRIG TUBING LAPAROSCOPIC (IRRIGATION / IRRIGATOR) ×3 IMPLANT
SHEARS HARMONIC ACE PLUS 45CM (MISCELLANEOUS) ×3 IMPLANT
SLEEVE GASTRECTOMY 40FR VISIGI (MISCELLANEOUS) ×3 IMPLANT
SLEEVE XCEL OPT CAN 5 100 (ENDOMECHANICALS) ×9 IMPLANT
SOLUTION ANTI FOG 6CC (MISCELLANEOUS) ×3 IMPLANT
SPONGE GAUZE 2X2 STER 10/PKG (GAUZE/BANDAGES/DRESSINGS)
SPONGE LAP 18X18 X RAY DECT (DISPOSABLE) ×3 IMPLANT
STAPLER ECHELON BIOABSB 60 FLE (MISCELLANEOUS) ×9 IMPLANT
STAPLER ECHELON LONG 60 440 (INSTRUMENTS) IMPLANT
STAPLER RELOAD BLUE 60MM (STAPLE) ×3
STAPLER RELOAD GOLD 60MM (STAPLE) ×3
STAPLER RELOAD GREEN 60MM (STAPLE) ×3
STRIP CLOSURE SKIN 1/2X4 (GAUZE/BANDAGES/DRESSINGS) ×2 IMPLANT
SUT MNCRL AB 4-0 PS2 18 (SUTURE) ×6 IMPLANT
SUT RELOAD ENDO STITCH 2 48X1 (ENDOMECHANICALS) ×1
SUT SURGIDAC NAB ES-9 0 48 120 (SUTURE) ×6 IMPLANT
SUT VICRYL 0 TIES 12 18 (SUTURE) ×3 IMPLANT
SUTURE RELOAD END STTCH 2 48X1 (ENDOMECHANICALS) ×1 IMPLANT
SYR 20CC LL (SYRINGE) ×3 IMPLANT
SYR 50ML LL SCALE MARK (SYRINGE) ×3 IMPLANT
TOWEL OR 17X26 10 PK STRL BLUE (TOWEL DISPOSABLE) ×3 IMPLANT
TOWEL OR NON WOVEN STRL DISP B (DISPOSABLE) ×3 IMPLANT
TRAY FOLEY W/METER SILVER 16FR (SET/KITS/TRAYS/PACK) IMPLANT
TROCAR BLADELESS 15MM (ENDOMECHANICALS) ×3 IMPLANT
TROCAR BLADELESS OPT 5 100 (ENDOMECHANICALS) ×3 IMPLANT
TUBING CONNECTING 10 (TUBING) ×4 IMPLANT
TUBING CONNECTING 10' (TUBING) ×2
TUBING ENDO SMARTCAP (MISCELLANEOUS) ×3 IMPLANT
TUBING INSUF HEATED (TUBING) ×3 IMPLANT

## 2017-09-13 NOTE — Interval H&P Note (Signed)
History and Physical Interval Note:  09/13/2017 11:38 AM  Lauren Oconnor  has presented today for surgery, with the diagnosis of MORBID OBESITY  The various methods of treatment have been discussed with the patient and family. After consideration of risks, benefits and other options for treatment, the patient has consented to  Procedure(s): LAPAROSCOPIC GASTRIC SLEEVE RESECTION WITH UPPER ENDO (N/A) as a surgical intervention .  The patient's history has been reviewed, patient examined, no change in status, stable for surgery.  I have reviewed the patient's chart and labs.  Questions were answered to the patient's satisfaction.    Mary SellaEric M. Andrey CampanileWilson, MD, FACS General, Bariatric, & Minimally Invasive Surgery [facility] [place]   Lauren Oconnor

## 2017-09-13 NOTE — Transfer of Care (Addendum)
Immediate Anesthesia Transfer of Care Note  Patient: Scot JunMiranda Koffler  Procedure(s) Performed: LAPAROSCOPIC GASTRIC SLEEVE RESECTION WITH HIATAL HERNIA REPAIR AND UPPER ENDOSCOPY (N/A Abdomen)  Patient Location: PACU  Anesthesia Type:General  Level of Consciousness: awake, alert , oriented and sedated  Airway & Oxygen Therapy: Patient connected to face mask oxygen  Post-op Assessment: Report given to RN and Post -op Vital signs reviewed and stable  Post vital signs: Reviewed  Last Vitals:  Vitals:   09/13/17 1031  BP: 130/72  Pulse: 91  Resp: 18  Temp: 37.1 C  SpO2: 99%    Last Pain:  Vitals:   09/13/17 1031  TempSrc: Oral         Complications: No apparent anesthesia complications

## 2017-09-13 NOTE — Anesthesia Postprocedure Evaluation (Signed)
Anesthesia Post Note  Patient: Scot JunMiranda Maston  Procedure(s) Performed: LAPAROSCOPIC GASTRIC SLEEVE RESECTION WITH HIATAL HERNIA REPAIR AND UPPER ENDOSCOPY (N/A Abdomen)     Patient location during evaluation: PACU Anesthesia Type: General Level of consciousness: awake and alert Pain management: pain level controlled Vital Signs Assessment: post-procedure vital signs reviewed and stable Respiratory status: spontaneous breathing, nonlabored ventilation, respiratory function stable and patient connected to nasal cannula oxygen Cardiovascular status: blood pressure returned to baseline and stable Postop Assessment: no apparent nausea or vomiting Anesthetic complications: no    Last Vitals:  Vitals:   09/13/17 1341 09/13/17 1400  BP: 135/76   Pulse: 96   Resp: 14 10  Temp: 37 C   SpO2:      Last Pain:  Vitals:   09/13/17 1031  TempSrc: Oral                 Nalia Honeycutt S

## 2017-09-13 NOTE — Anesthesia Procedure Notes (Signed)
Procedure Name: Intubation Date/Time: 09/13/2017 11:53 AM Performed by: Theodosia QuayKey, Kendrell Lottman, CRNA Pre-anesthesia Checklist: Patient identified, Emergency Drugs available, Suction available and Patient being monitored Patient Re-evaluated:Patient Re-evaluated prior to induction Oxygen Delivery Method: Circle System Utilized Preoxygenation: Pre-oxygenation with 100% oxygen Induction Type: IV induction Ventilation: Mask ventilation without difficulty Laryngoscope Size: Miller and 3 Grade View: Grade I Tube type: Oral Number of attempts: 1 Airway Equipment and Method: Stylet and Oral airway Placement Confirmation: ETT inserted through vocal cords under direct vision,  positive ETCO2 and breath sounds checked- equal and bilateral Secured at: 22 cm Tube secured with: Tape Dental Injury: Teeth and Oropharynx as per pre-operative assessment  Comments: Placed by Paschal DoppKisan Patel SRNA

## 2017-09-13 NOTE — Discharge Instructions (Signed)
° ° ° °GASTRIC BYPASS/SLEEVE ° Home Care Instructions ° ° These instructions are to help you care for yourself when you go home. ° °Call: If you have any problems. °• Call [phone] and ask for the surgeon on call °• If you need immediate help, come to the ER at [facility].  °• Tell the ER staff that you are a new post-op gastric bypass or gastric sleeve patient °  °Signs and symptoms to report: • Severe vomiting or nausea °o If you cannot keep down clear liquids for longer than 1 day, call your surgeon  °• Abdominal pain that does not get better after taking your pain medication °• Fever over 100.4° F with chills °• Heart beating over 100 beats a minute °• Shortness of breath at rest °• Chest pain °•  Redness, swelling, drainage, or foul odor at incision (surgical) sites °•  If your incisions open or pull apart °• Swelling or pain in calf (lower leg) °• Diarrhea (Loose bowel movements that happen often), frequent watery, uncontrolled bowel movements °• Constipation, (no bowel movements for 3 days) if this happens: Pick one °o Milk of Magnesia, 2 tablespoons by mouth, 3 times a day for 2 days if needed °o Stop taking Milk of Magnesia once you have a bowel movement °o Call your doctor if constipation continues °Or °o Miralax  (instead of Milk of Magnesia) following the label instructions °o Stop taking Miralax once you have a bowel movement °o Call your doctor if constipation continues °• Anything you think is not normal °  °Normal side effects after surgery: • Unable to sleep at night or unable to focus °• Irritability or moody °• Being tearful (crying) or depressed °These are common complaints, possibly related to your anesthesia medications that put you to sleep, stress of surgery, and change in lifestyle.  This usually goes away a few weeks after surgery.  If these feelings continue, call your primary care doctor. °  °Wound Care: You may have surgical glue, steri-strips, or staples over your incisions after  surgery °• Surgical glue:  Looks like a clear film over your incisions and will wear off a little at a time °• Steri-strips: Strips of tape over your incisions. You may notice a yellowish color on the skin under the steri-strips. This is used to make the   steri-strips stick better. Do not pull the steri-strips off - let them fall off °• Staples: Staples may be removed before you leave the hospital °o If you go home with staples, call [facility], [phone] at for an appointment with your surgeon’s nurse to have staples removed 10 days after surgery. °• Showering: You may shower two (2) days after your surgery unless your surgeon tells you differently °o Wash gently around incisions with warm soapy water, rinse well, and gently pat dry  °o No tub baths until staples are removed, steri-strips fall off or glue is gone.  °  °Medications: • Medications should be liquid or crushed if larger than the size of a dime °• Extended release pills (medication that release a little bit at a time through the day) should NOT be crushed or cut. (examples include XL, ER, DR, SR) °• Depending on the size and number of medications you take, you may need to space (take a few throughout the day)/change the time you take your medications so that you do not over-fill your pouch (smaller stomach) °• Make sure you follow-up with your primary care doctor to   make medication changes needed during rapid weight loss and life-style changes °• If you have diabetes, follow up with the doctor that orders your diabetes medication(s) within one week after surgery and check your blood sugar regularly. °• Do not drive while taking prescription pain medication  °• It is ok to take Tylenol by the bottle instructions with your pain medicine or instead of your pain medicine as needed.  DO NOT TAKE NSAIDS (EXAMPLES OF NSAIDS:  IBUPROFREN/ NAPROXEN)  °Diet:                    First 2 Weeks ° You will see the dietician t about two (2) weeks  after your surgery. The dietician will increase the types of foods you can eat if you are handling liquids well: °• If you have severe vomiting or nausea and cannot keep down clear liquids lasting longer than 1 day, call your surgeon @ ([phone]) °Protein Shake °• Drink at least 2 ounces of shake 5-6 times per day °• Each serving of protein shakes (usually 8 - 12 ounces) should have: °o 15 grams of protein  °o And no more than 5 grams of carbohydrate  °• Goal for protein each day: °o Men = 80 grams per day °o Women = 60 grams per day °• Protein powder may be added to fluids such as non-fat milk or Lactaid milk or unsweetened Soy/Almond milk (limit to 35 grams added protein powder per serving) ° °Hydration °• Slowly increase the amount of water and other clear liquids as tolerated (See Acceptable Fluids) °• Slowly increase the amount of protein shake as tolerated  °•  Sip fluids slowly and throughout the day.  Do not use straws. °• May use sugar substitutes in small amounts (no more than 6 - 8 packets per day; i.e. Splenda) ° °Fluid Goal °• The first goal is to drink at least 8 ounces of protein shake/drink per day (or as directed by the nutritionist); some examples of protein shakes are Syntrax Nectar, Adkins Advantage, EAS Edge HP, and Unjury. See handout from pre-op Bariatric Education Class: °o Slowly increase the amount of protein shake you drink as tolerated °o You may find it easier to slowly sip shakes throughout the day °o It is important to get your proteins in first °• Your fluid goal is to drink 64 - 100 ounces of fluid daily °o It may take a few weeks to build up to this °• 32 oz (or more) should be clear liquids  °And  °• 32 oz (or more) should be full liquids (see below for examples) °• Liquids should not contain sugar, caffeine, or carbonation ° °Clear Liquids: °• Water or Sugar-free flavored water (i.e. Fruit H2O, Propel) °• Decaffeinated coffee or tea (sugar-free) °• Crystal Lite, Wyler’s Lite,  Minute Maid Lite °• Sugar-free Jell-O °• Bouillon or broth °• Sugar-free Popsicle:   *Less than 20 calories each; Limit 1 per day ° °Full Liquids: °Protein Shakes/Drinks + 2 choices per day of other full liquids °• Full liquids must be: °o No More Than 15 grams of Carbs per serving  °o No More Than 3 grams of Fat per serving °• Strained low-fat cream soup (except Cream of Potato or Tomato) °• Non-Fat milk °• Fat-free Lactaid Milk °• Unsweetened Soy Or Unsweetened Almond Milk °• Low Sugar yogurt (Dannon Lite & Fit, Greek yogurt; Oikos Triple Zero; Chobani Simply 100; Yoplait 100 calorie Greek - No Fruit on the Bottom) ° °  °Vitamins   and Minerals • Start 1 day after surgery unless otherwise directed by your surgeon °• 2 Chewable Bariatric Specific Multivitamin / Multimineral Supplement with iron (Example: Bariatric Advantage Multi EA) °• Chewable Calcium with Vitamin D-3 °(Example: 3 Chewable Calcium Plus 600 with Vitamin D-3) °o Take 500 mg three (3) times a day for a total of 1500 mg each day °o Do not take all 3 doses of calcium at one time as it may cause constipation, and you can only absorb 500 mg  at a time  °o Do not mix multivitamins containing iron with calcium supplements; take 2 hours apart °• Menstruating women and those with a history of anemia (a blood disease that causes weakness) may need extra iron °o Talk with your doctor to see if you need more iron °• Do not stop taking or change any vitamins or minerals until you talk to your dietitian or surgeon °• Your Dietitian and/or surgeon must approve all vitamin and mineral supplements °  °Activity and Exercise: Limit your physical activity as instructed by your doctor.  It is important to continue walking at home.  During this time, use these guidelines: °• Do not lift anything greater than ten (10) pounds for at least two (2) weeks °• Do not go back to work or drive until your surgeon says you can °• You may have sex when you feel comfortable  °o It is  VERY important for female patients to use a reliable birth control method; fertility often increases after surgery  °o All hormonal birth control will be ineffective for 30 days after surgery due to medications given during surgery a barrier method must be used. °o Do not get pregnant for at least 18 months °• Start exercising as soon as your doctor tells you that you can °o Make sure your doctor approves any physical activity °• Start with a simple walking program °• Walk 5-15 minutes each day, 7 days per week.  °• Slowly increase until you are walking 30-45 minutes per day °Consider joining our BELT program. [phone] or email [email or web address] °  °Special Instructions Things to remember: °• Use your CPAP when sleeping if this applies to you ° °• [facility] has two free Bariatric Surgery Support Groups that meet monthly °o The 3rd Thursday of each month, 6 pm, [facility]  °o The 2nd Friday of each month, 11:45 am in the private dining room in the basement of  °• It is very important to keep all follow up appointments with your surgeon, dietitian, primary care physician, and behavioral health practitioner °• Routine follow up schedule with your surgeon include appointments at 2-3 weeks, 6-8 weeks, 6 months, and 1 year at a minimum.  Your surgeon may request to see you more often.   °o After the first year, please follow up with your bariatric surgeon and dietitian at least once a year in order to maintain best weight loss results °[facility]: [phone] °[facility]: [phone] °Bariatric Nurse Coordinator: [phone] °  °   Reviewed and Endorsed  °by [facility] Patient Education Committee, June, 2016 °Edits Approved: Aug, 2018 ° ° ° °

## 2017-09-13 NOTE — Progress Notes (Signed)
Pt c/o nausea. Zofran 4mg  IV given.  Pt vomited 200cc light pink stomach contents.  Pt given tylenol and gabapentin susp as ordered.  Pt tolerated 60cc ice/water earlier

## 2017-09-13 NOTE — Op Note (Signed)
Preoperative diagnosis: laparoscopic sleeve gastrectomy  Postoperative diagnosis: Same   Procedure: Upper endoscopy   Surgeon: Iowa Kappes, M.D.  Anesthesia: Gen.   Indications for procedure: This patient was undergoing a laparoscopic sleeve gastrectomy.   Description of procedure: The endoscopy was placed in the mouth and into the oropharynx and under endoscopic vision it was advanced to the esophagogastric junction. The pouch was insufflated and no bleeding or bubbles were seen. The GEJ was identified at 40cm from the teeth.  No bleeding or leaks were detected. The scope was withdrawn without difficulty.   Lauren Oconnor, M.D. General, Bariatric, & Minimally Invasive Surgery [facility]    

## 2017-09-13 NOTE — Op Note (Signed)
09/13/2017 Scot JunMiranda Lorson [birth year] [id number]   PRE-OPERATIVE DIAGNOSIS:     Morbid obesity BMI 48   Bilateral chronic knee pain Hiatal hernia   POST-OPERATIVE DIAGNOSIS:  same  PROCEDURE:  Procedure(s): LAPAROSCOPIC SLEEVE GASTRECTOMY WITH HIATAL HERNIA REPAIR UPPER GI ENDOSCOPY  SURGEON:  Surgeon(s): Atilano InaEric M Fontaine Kossman, MD FACS FASMBS  ASSISTANTS: Feliciana RossettiLuke Kinsinger MD  Lelon MastSamantha Couillard PA-S  ANESTHESIA:   general  DRAINS: none   BOUGIE: 40 fr ViSiGi  LOCAL MEDICATIONS USED:   Exparel  EBL: <30cc  SPECIMEN:  Source of Specimen:  Greater curvature of stomach  DISPOSITION OF SPECIMEN:  PATHOLOGY  COUNTS:  YES  INDICATION FOR PROCEDURE: This is a very pleasant 32 y.o.-year-old morbidly obese female who has had unsuccessful attempts for sustained weight loss. The patient presents today for a planned laparoscopic sleeve gastrectomy with upper endoscopy. We have discussed the risk and benefits of the procedure extensively preoperatively. Please see my separate notes.  PROCEDURE: After obtaining informed consent and receiving 5000 units of subcutaneous heparin, the patient was [facility] and placed supine on the operating room table. General endotracheal anesthesia was established. Sequential compression devices were placed. A orogastric tube was placed. The patient's abdomen was prepped and draped in the usual standard surgical fashion. The patient received preoperative IV antibiotic. A surgical timeout was performed. ERAS protocol used.   Access to the abdomen was achieved using a 5 mm 0 laparoscope thru a 5 mm trocar In the left upper Quadrant 2 fingerbreadths below the left subcostal margin using the Optiview technique. Pneumoperitoneum was smoothly established up to 15 mm of mercury. The laparoscope was advanced and the abdominal cavity was surveilled. The patient was then placed in reverse Trendelenburg.   A 5 mm trocar was placed  slightly above and to the left of the umbilicus under direct visualization.  The [facility] liver retractor was placed under the left lobe of the liver through a 5 mm trocar incision site in the subxiphoid position. A 5 mm trocar was placed in the lateral right upper quadrant along with a 15 mm trocar in the mid right abdomen. A final 5 mm trocar was placed in the lateral LUQ.  All under direct visualization after exparel had been infiltrated in bilateral lateral upper abdominal walls as a TAP block.  The stomach was inspected. It was completely decompressed and the orogastric tube was removed.  There is a small anterior dimple that was obviously visible. The calibration tube was placed in the oropharynx and guided down into the stomach by the CRNA. 10 mL of air was insufflated into the calibration balloon. The calibration tubing was then gently pulled back by the CRNA and it slid past the GE junction. At this point the calibration tubing was desufflated and pulled back into the esophagus. This confirmed my suspicion of a clinically significant hiatal hernia. The gastrohepatic ligament was incised with harmonic scalpel. The right crus was identified. We identified the crossing fat along the right crus. The adipose tissue just above this area was incised with harmonic scalpel. I then bluntly dissected out this area and identified the left crus. There was evidence of a hiatal hernia. I then mobilized the esophagus. The left and right crus were further mobilized with blunt dissection. I was then able to reapproximate the left and right crus with 0 Ethibond using an Endostitch suture device and securing it with a titanium tyknot. I placed a second suture in a similar fashion. We  then had the CRNA readvanced the calibration tubing back into the stomach. 10 mL of air was insufflated into the calibration tube balloon. The calibration tube was then gently pulled back and there was resistance at the GE junction. The tube  did not slide back up into the esophagus. At this point the calibration tubing was deflated and removed from the patient's body.   We identified the pylorus and measured 6 cm proximal to the pylorus and identified an area of where we would start taking down the short gastric vessels. Harmonic scalpel was used to take down the short gastric vessels along the greater curvature of the stomach. We were able to enter the lesser sac. We continued to march along the greater curvature of the stomach taking down the short gastrics. As we approached the gastrosplenic ligament we took care in this area not to injure the spleen. We were able to take down the entire gastrosplenic ligament. We then mobilized the fundus away from the left crus of diaphragm. There were not any significant posterior gastric avascular attachments. This left the stomach completely mobilized. No vessels had been taken down along the lesser curvature of the stomach.  We then reidentified the pylorus. A 40Fr ViSiGi was then placed in the oropharynx and advanced down into the stomach and placed in the distal antrum and positioned along the lesser curvature. It was placed under suction which secured the 40Fr ViSiGi in place along the lesser curve. Then using the Ethicon echelon 60 mm stapler with a green load with Seamguard, I placed a stapler along the antrum approximately 5 cm from the pylorus. The stapler was angled so that there is ample room at the angularis incisura. I then fired the first staple load after inspecting it posteriorly to ensure adequate space both anteriorly and posteriorly.   At this point I started using 60 mm gold load staple cartridge x 1 with Seamguard. The echelon stapler was then repositioned with a 60 mm blue load with Seamguard and we continued to march up along the ViSiGi. My assistant was holding traction along the greater curvature stomach along the cauterized short gastric vessels ensuring that the stomach was  symmetrically retracted. Prior to each firing of the staple, we rotated the stomach to ensure that there is adequate stomach left.  As we approached the fundus, I used 60 mm blue cartridge with Seamguard aiming  lateral to the GE junction after mobilizing some of the esophageal fat pad.  The sleeve was inspected. There is no evidence of cork screw. The staple line appeared hemostatic except at edge of the very first staple line in the antrum. A 2-0 vicryl endostitch was used for hemostasis. The CRNA inflated the ViSiGi to the green zone and the upper abdomen was flooded with saline. There were no bubbles. The sleeve was decompressed and the ViSiGi removed. My assistant scrubbed out and performed an upper endoscopy. The sleeve easily distended with air and the scope was easily advanced to the pylorus. There is no evidence of internal bleeding or cork screwing. There was no narrowing at the angularis. There is no evidence of bubbles. Please see his operative note for further details. The gastric sleeve was decompressed and the endoscope was removed.  The greater curvature the stomach was grasped with a laparoscopic grasper and removed from the 15 mm trocar site.  The liver retractor was removed. I then closed the 15 mm trocar site with 1 interrupted 0 Vicryl sutures through the fascia using the  endoclose. The closure was viewed laparoscopically and it was airtight. Remaining Exparel was then infiltrated in the preperitoneal spaces around the trocar sites. Pneumoperitoneum was released. The PA student scrubbed in at this point and assisted me with skin closure. All trocar sites were closed with a 4-0 Monocryl in a subcuticular fashion followed by the application of benzoin, steri-strips, and bandaids. The patient was extubated and taken to the recovery room in stable condition. All needle, instrument, and sponge counts were correct x2. There are no immediate complications  (1) 60 mm green with Seamguard (1) 60 mm  gold with seamguard (3) 60 mm blue with 2 seamguard  PLAN OF CARE: Admit to inpatient   PATIENT DISPOSITION:  PACU - hemodynamically stable.   Delay start of Pharmacological VTE agent (>24hrs) due to surgical blood loss or risk of bleeding:  no  Mary Sella. Andrey Campanile, MD, FACS FASMBS General, Bariatric, & Minimally Invasive Surgery [facility], [place]

## 2017-09-13 NOTE — Anesthesia Preprocedure Evaluation (Signed)
Anesthesia Evaluation  Patient identified by MRN, date of birth, ID band Patient awake    Reviewed: Allergy & Precautions, NPO status , Patient's Chart, lab work & pertinent test results  Airway Mallampati: II  TM Distance: >3 FB Neck ROM: Full    Dental no notable dental hx.    Pulmonary neg pulmonary ROS,    Pulmonary exam normal breath sounds clear to auscultation       Cardiovascular negative cardio ROS Normal cardiovascular exam Rhythm:Regular Rate:Normal     Neuro/Psych negative neurological ROS  negative psych ROS   GI/Hepatic Neg liver ROS, hiatal hernia,   Endo/Other  Hypothyroidism Morbid obesity  Renal/GU negative Renal ROS  negative genitourinary   Musculoskeletal negative musculoskeletal ROS (+)   Abdominal   Peds negative pediatric ROS (+)  Hematology negative hematology ROS (+)   Anesthesia Other Findings   Reproductive/Obstetrics negative OB ROS                             Anesthesia Physical Anesthesia Plan  ASA: II  Anesthesia Plan: General   Post-op Pain Management:    Induction: Intravenous and Rapid sequence  PONV Risk Score and Plan: 3 and Ondansetron, Dexamethasone, Midazolam and Treatment may vary due to age or medical condition  Airway Management Planned: Oral ETT  Additional Equipment:   Intra-op Plan:   Post-operative Plan: Extubation in OR  Informed Consent: I have reviewed the patients History and Physical, chart, labs and discussed the procedure including the risks, benefits and alternatives for the proposed anesthesia with the patient or authorized representative who has indicated his/her understanding and acceptance.   Dental advisory given  Plan Discussed with: CRNA and Surgeon  Anesthesia Plan Comments:         Anesthesia Quick Evaluation

## 2017-09-13 NOTE — Progress Notes (Signed)
Patient arrived to room on [address].  Complaints of being cold with shivers warm blanket provided and patient helped to reposition in bed.  Nausea indicated with movement.  No family at bedside. Discussed patient progression with RN.

## 2017-09-14 LAB — CBC WITH DIFFERENTIAL/PLATELET
Basophils Absolute: 0 10*3/uL (ref 0.0–0.1)
Basophils Relative: 0 %
EOS PCT: 0 %
Eosinophils Absolute: 0 10*3/uL (ref 0.0–0.7)
HEMATOCRIT: 34.9 % — AB (ref 36.0–46.0)
Hemoglobin: 11.4 g/dL — ABNORMAL LOW (ref 12.0–15.0)
LYMPHS ABS: 0.9 10*3/uL (ref 0.7–4.0)
LYMPHS PCT: 9 %
MCH: 24.9 pg — AB (ref 26.0–34.0)
MCHC: 32.7 g/dL (ref 30.0–36.0)
MCV: 76.4 fL — AB (ref 78.0–100.0)
MONO ABS: 0.6 10*3/uL (ref 0.1–1.0)
MONOS PCT: 6 %
NEUTROS ABS: 7.8 10*3/uL — AB (ref 1.7–7.7)
Neutrophils Relative %: 85 %
PLATELETS: 250 10*3/uL (ref 150–400)
RBC: 4.57 MIL/uL (ref 3.87–5.11)
RDW: 15.1 % (ref 11.5–15.5)
WBC: 9.3 10*3/uL (ref 4.0–10.5)

## 2017-09-14 LAB — COMPREHENSIVE METABOLIC PANEL
ALT: 15 U/L (ref 14–54)
AST: 26 U/L (ref 15–41)
Albumin: 3.3 g/dL — ABNORMAL LOW (ref 3.5–5.0)
Alkaline Phosphatase: 51 U/L (ref 38–126)
Anion gap: 7 (ref 5–15)
BILIRUBIN TOTAL: 0.4 mg/dL (ref 0.3–1.2)
BUN: 9 mg/dL (ref 6–20)
CHLORIDE: 101 mmol/L (ref 101–111)
CO2: 24 mmol/L (ref 22–32)
CREATININE: 0.93 mg/dL (ref 0.44–1.00)
Calcium: 8.6 mg/dL — ABNORMAL LOW (ref 8.9–10.3)
Glucose, Bld: 138 mg/dL — ABNORMAL HIGH (ref 65–99)
POTASSIUM: 3.7 mmol/L (ref 3.5–5.1)
Sodium: 132 mmol/L — ABNORMAL LOW (ref 135–145)
TOTAL PROTEIN: 7.1 g/dL (ref 6.5–8.1)

## 2017-09-14 MED ORDER — KETOROLAC TROMETHAMINE 30 MG/ML IJ SOLN
15.0000 mg | Freq: Three times a day (TID) | INTRAMUSCULAR | Status: AC
  Administered 2017-09-14 – 2017-09-15 (×3): 15 mg via INTRAVENOUS
  Filled 2017-09-14 (×3): qty 1

## 2017-09-14 NOTE — Progress Notes (Signed)
Patient ID: Scot JunMiranda Landin, female   DOB: Sep 20, 1985, 32 y.o.   MRN: [id number]    Progress Note: Metabolic and Bariatric Surgery Service   Chief Complaint/Subjective: Fair amount of nausea and several episodes of emesis Mainly abd pain when vomit Ambulated some  Objective: Vital signs in last 24 hours: Temp:  [97.8 F (36.6 C)-99.6 F (37.6 C)] 99.1 F (37.3 C) (02/27 0602) Pulse Rate:  [72-96] 76 (02/27 0602) Resp:  [14-24] 16 (02/27 0602) BP: (118-143)/(69-110) 124/72 (02/27 0602) SpO2:  [93 %-100 %] 100 % (02/27 0602) Weight:  [120.6 kg (265 lb 12.8 oz)] 120.6 kg (265 lb 12.8 oz) (02/26 1000)    Intake/Output from previous day: 02/26 0701 - 02/27 0700 In: 3298.8 [P.O.:180; I.V.:3118.8] Out: 1500 [Urine:1200; Emesis/NG output:250; Blood:50] Intake/Output this shift: No intake/output data recorded.  Lungs: cta, symm  Cardiovascular: reg, not tachy  Abd: soft, obese, mild approp TTP, incisions ok  Extremities: no edema, +SCDs  Neuro: nonfocal, alert  Nontoxic but not smiling, looks a little uncomfortable  Lab Results: CBC  Recent Labs    09/13/17 1536 09/14/17 0413  WBC  --  9.3  HGB 12.1 11.4*  HCT 37.1 34.9*  PLT  --  250   BMET Recent Labs    09/14/17 0413  NA 132*  K 3.7  CL 101  CO2 24  GLUCOSE 138*  BUN 9  CREATININE 0.93  CALCIUM 8.6*   PT/INR No results for input(s): LABPROT, INR in the last 72 hours. ABG No results for input(s): PHART, HCO3 in the last 72 hours.  Invalid input(s): PCO2, PO2  Studies/Results:  Anti-infectives: Anti-infectives (From admission, onward)   Start     Dose/Rate Route Frequency Ordered Stop   09/13/17 1019  cefoTEtan in Dextrose 5% (CEFOTAN) IVPB 2 g  Status:  Discontinued     2 g Intravenous On call to O.R. 09/13/17 1019 09/13/17 1514      Medications: Scheduled Meds: . acetaminophen (TYLENOL) oral liquid 160 mg/5 mL  650 mg Oral Q6H  . enoxaparin (LOVENOX) injection  30 mg Subcutaneous Q12H  .  gabapentin  200 mg Oral Q12H  . ketorolac  15 mg Intravenous Q8H  . pantoprazole (PROTONIX) IV  40 mg Intravenous QHS  . protein supplement shake  2 oz Oral Q2H   Continuous Infusions: . dextrose 5 % and 0.45 % NaCl with KCl 20 mEq/L 125 mL/hr at 09/14/17 0312   PRN Meds:.diphenhydrAMINE, morphine injection, ondansetron (ZOFRAN) IV, oxyCODONE, promethazine, simethicone  Assessment/Plan: Patient Active Problem List   Diagnosis Date Noted  . Morbid obesity (HCC) 09/13/2017  . Bilateral chronic knee pain 09/13/2017   s/p Procedure(s): LAPAROSCOPIC GASTRIC SLEEVE RESECTION WITH HIATAL HERNIA REPAIR AND UPPER ENDOSCOPY 09/13/2017  No fever, tachycardia, no wbc Suspicion for leak very very low Cont zofran/phenergan as needed Add toradol for pain control  Will not be ready today Ambulate Cont chemical vte prophylaxis  Disposition:  LOS: 1 day  The patient does not meet criteria for discharge because She has persistant nausea and vomiting and is at high risk of dehydration  Gaynelle AduEric Sahvannah Rieser, MD [phone] [facility]

## 2017-09-14 NOTE — Progress Notes (Signed)
Patient alert and oriented, Post op day 1.  Provided support and encouragement.  Encouraged pulmonary toilet, ambulation and small sips of liquids. Patient sitting in chair, sleepy from nausea medication.  Working on bari clear fluids.  All questions answered.  Will continue to monitor.

## 2017-09-15 LAB — CBC WITH DIFFERENTIAL/PLATELET
BASOS ABS: 0 10*3/uL (ref 0.0–0.1)
Basophils Relative: 0 %
EOS ABS: 0 10*3/uL (ref 0.0–0.7)
EOS PCT: 0 %
HCT: 32.4 % — ABNORMAL LOW (ref 36.0–46.0)
Hemoglobin: 10.6 g/dL — ABNORMAL LOW (ref 12.0–15.0)
LYMPHS PCT: 38 %
Lymphs Abs: 2.7 10*3/uL (ref 0.7–4.0)
MCH: 25.2 pg — AB (ref 26.0–34.0)
MCHC: 32.7 g/dL (ref 30.0–36.0)
MCV: 77.1 fL — AB (ref 78.0–100.0)
MONO ABS: 0.5 10*3/uL (ref 0.1–1.0)
Monocytes Relative: 6 %
Neutro Abs: 3.9 10*3/uL (ref 1.7–7.7)
Neutrophils Relative %: 56 %
PLATELETS: 198 10*3/uL (ref 150–400)
RBC: 4.2 MIL/uL (ref 3.87–5.11)
RDW: 15.2 % (ref 11.5–15.5)
WBC: 7 10*3/uL (ref 4.0–10.5)

## 2017-09-15 MED ORDER — OXYCODONE HCL 5 MG/5ML PO SOLN: 5.0000 mg | ORAL | 0 refills | Status: DC | PRN

## 2017-09-15 NOTE — Progress Notes (Signed)
Assessment unchanged. Pt verbalized understanding of dc instructions through teach back regarding follow up care, when to call the doctor, including all that Corene Corneaawn Williams-James, RN had reviewed earlier. IV dc'd. Discharged via wc to front entrance accompanied by NT and mother.

## 2017-09-15 NOTE — Progress Notes (Signed)
Patient alert and oriented, pain is controlled. Patient is tolerating fluids, advanced to protein shake today, patient is tolerating well.  Reviewed Gastric sleeve discharge instructions with patient and patient is able to articulate understanding.  Provided information on BELT program, Support Group and [facility] outpatient pharmacy. All questions answered, will continue to monitor.  

## 2017-09-15 NOTE — Progress Notes (Signed)
Patient ID: Lauren Oconnor, female   DOB: [birth year], 32 y.o.   MRN: [id number]   Progress Note: Metabolic and Bariatric Surgery Service   Chief Complaint/Subjective: No more nausea. No emesis Ambulated Min pain Drank water and on shake  Objective: Vital signs in last 24 hours: Temp:  [98.6 F (37 C)-99.2 F (37.3 C)] 99 F (37.2 C) (02/28 0520) Pulse Rate:  [58-120] 62 (02/28 0520) Resp:  [15-18] 15 (02/28 0520) BP: (104-136)/(66-84) 104/66 (02/28 0520) SpO2:  [92 %-99 %] 92 % (02/28 0520) Weight:  [123.6 kg (272 lb 7.8 oz)] 123.6 kg (272 lb 7.8 oz) (02/28 0520)    Intake/Output from previous day: 02/27 0701 - 02/28 0700 In: 3360 [P.O.:360; I.V.:3000] Out: 700 [Urine:700] Intake/Output this shift: No intake/output data recorded.  Lungs: cta ant, nonlabored  Cardiovascular: reg  Abd: soft, obese, approp mild TTP  Extremities: no edema, +SCDs  Neuro: nonfocal  Gen: sitting in chair, much more comfortable looking today than yesterday  Lab Results: CBC  Recent Labs    09/14/17 0413 09/15/17 0420  WBC 9.3 7.0  HGB 11.4* 10.6*  HCT 34.9* 32.4*  PLT 250 198   BMET Recent Labs    09/14/17 0413  NA 132*  K 3.7  CL 101  CO2 24  GLUCOSE 138*  BUN 9  CREATININE 0.93  CALCIUM 8.6*   PT/INR No results for input(s): LABPROT, INR in the last 72 hours. ABG No results for input(s): PHART, HCO3 in the last 72 hours.  Invalid input(s): PCO2, PO2  Studies/Results:  Anti-infectives: Anti-infectives (From admission, onward)   Start     Dose/Rate Route Frequency Ordered Stop   09/13/17 1019  cefoTEtan in Dextrose 5% (CEFOTAN) IVPB 2 g  Status:  Discontinued     2 g Intravenous On call to O.R. 09/13/17 1019 09/13/17 1514      Medications: Scheduled Meds: . acetaminophen (TYLENOL) oral liquid 160 mg/5 mL  650 mg Oral Q6H  . enoxaparin (LOVENOX) injection  30 mg Subcutaneous Q12H  . gabapentin  200 mg Oral Q12H  . pantoprazole (PROTONIX) IV  40 mg  Intravenous QHS  . protein supplement shake  2 oz Oral Q2H   Continuous Infusions: . dextrose 5 % and 0.45 % NaCl with KCl 20 mEq/L 125 mL/hr at 09/15/17 0311   PRN Meds:.diphenhydrAMINE, morphine injection, ondansetron (ZOFRAN) IV, oxyCODONE, promethazine, simethicone  Assessment/Plan: Patient Active Problem List   Diagnosis Date Noted  . Morbid obesity (HCC) 09/13/2017  . Bilateral chronic knee pain 09/13/2017   s/p Procedure(s): LAPAROSCOPIC GASTRIC SLEEVE RESECTION WITH HIATAL HERNIA REPAIR AND UPPER ENDOSCOPY 09/13/2017  Doing well Looks much better today Start dc teaching Discussed dc instructions  Disposition:  LOS: 2 days  The patient should be discharged from the hospital today  Gaynelle AduEric Madgeline Rayo, MD [phone] [facility]

## 2017-09-15 NOTE — Plan of Care (Signed)

## 2017-09-19 ENCOUNTER — Telehealth (HOSPITAL_COMMUNITY): Payer: Self-pay

## 2017-09-19 NOTE — Telephone Encounter (Signed)
Patient called to discuss post bariatric surgery follow up questions.  See below:   1.  Tell me about your pain and pain management?Patient reports cramping pain when drinking in her stomach and sometimes at night.  She did discuss this with oncall MD this weekend.  She states Tylenol helps this pain during the day.  We discussed it is not uncommon to have cramping pain when drinking fluids at this time, but if the pain changed or became constant or unrelieved with Tylenol she should alert [facility].  Also, she was not taking protonix. We discussed taking this at night several hours before bedtime.    2.  Let's talk about fluid intake.  How much total fluid are you taking in?42 ounces of fluid.  22 ounces from Premiere protein shake and 20 ounces from Vitamin Water Zero  3.  How much protein have you taken in the last 2 days?60 grams or 2 premiere protein shakes  4.  Have you had nausea?  Tell me about when have experienced nausea and what you did to help?She reports no nausea  5.  Has the frequency or color changed with your urine?Making urine frequently, she does note it is clear but color of apple juice.  We discussed trying to increase water intake  6.  Tell me what your incisions look like?showered had not removed bandaids,  Instructed patient to remove bandaids, but leave steri strips on that they would fall off in 1-3 weeks from surgery.  Incisions are not red, draining, or open.  7.  Have you been passing gas? BM?Had several small bms.  Discussed constipation and options to relieve constipation  8.  If a problem or question were to arise who would you call?  Do you know contact numbers for [facility], [facility], and [facility]?patient is aware of how to contact all services.  She would like to change her appointment with Dr Andrey CampanileWilson.  Number provided to patient although patient aware of number already  9.  How has the walking going?Ambulating frequently  10.  How are your vitamins and calcium going?  How are you  taking them?Patient taking [employer] x1/day per instructions on bottle and 3 calcium.  She takes all 3 hours apart and away from Thyroid medication as well.

## 2017-09-20 NOTE — Discharge Summary (Signed)
Physician Discharge Summary  Lauren Oconnor CBU:[id number] DOB: [birth year] DOA: 09/13/2017  PCP: Leeroy Cha, MD  Admit date: 09/13/2017 Discharge date: 09/15/2017  Recommendations for Outpatient Follow-up:   Follow-up Information    [name], MD. Go on 09/28/2017.   Specialty:  General Surgery Why:  @ [address] information: [address] [place] [address] [phone]        [name], MD Follow up.   Specialty:  General Surgery Contact information: [place] [address] [phone]          Discharge Diagnoses:  Principal Problem:   Morbid obesity ([place]) Active Problems:   Bilateral chronic knee pain   Surgical Procedure: Laparoscopic Sleeve Gastrectomy with hiatal hernia repair, upper endoscopy  Discharge Condition: Good Disposition: Home  Diet recommendation: Postoperative sleeve gastrectomy diet (liquids only)  Filed Weights   09/13/17 1000 09/15/17 0520  Weight: 120.6 kg (265 lb 12.8 oz) 123.6 kg (272 lb 7.8 oz)     Hospital Course:  The patient was admitted for a planned laparoscopic sleeve gastrectomy. Please see operative note. Preoperatively the patient was given 5000 units of subcutaneous heparin for DVT prophylaxis. Postoperative prophylactic Lovenox dosing was started on the evening of postoperative day 0. ERAS protocol was used. On the evening of postoperative day 0, the patient was started on water and ice chips. On postoperative day 1 the patient had no fever or tachycardia and was tolerating water in their diet was gradually advanced throughout the day. The patient was ambulating without difficulty. Their vital signs are stable without fever or tachycardia. Their hemoglobin had remained stable.  The patient had received discharge instructions and counseling. They were deemed stable for discharge and had met discharge criteria   Discharge Instructions  Discharge Instructions    Ambulate  hourly while awake   Complete by:  As directed    Call MD for:  difficulty breathing, headache or visual disturbances   Complete by:  As directed    Call MD for:  persistant dizziness or light-headedness   Complete by:  As directed    Call MD for:  persistant nausea and vomiting   Complete by:  As directed    Call MD for:  redness, tenderness, or signs of infection (pain, swelling, redness, odor or green/yellow discharge around incision site)   Complete by:  As directed    Call MD for:  severe uncontrolled pain   Complete by:  As directed    Call MD for:  temperature >101 F   Complete by:  As directed    Diet bariatric full liquid   Complete by:  As directed    Discharge instructions   Complete by:  As directed    See bariatric discharge instructions   Incentive spirometry   Complete by:  As directed    Perform hourly while awake     Allergies as of 09/15/2017   No Known Allergies     Medication List    TAKE these medications   IRON PO Take 1 tablet by mouth daily.   levothyroxine 137 MCG tablet Commonly known as:  SYNTHROID, LEVOTHROID Take 137 mcg by mouth daily before breakfast.   MULTIVITAMIN PO Take 1 tablet by mouth daily.   oxyCODONE 5 MG/5ML solution Commonly known as:  ROXICODONE Take 5 mLs (5 mg total) by mouth every 4 (four) hours as needed for moderate pain or severe pain.   VITAMIN D3 PO Take 1 capsule by  mouth daily.      Follow-up Information    [name], MD. Go on 09/28/2017.   Specialty:  General Surgery Why:  @ [address] information: [address] [place] [address] [phone]        [name], MD Follow up.   Specialty:  General Surgery Contact information: [place] [address] [phone]            The results of significant diagnostics from this hospitalization (including imaging, microbiology, ancillary and laboratory) are listed below for reference.    Significant  Diagnostic Studies: No results found.  Labs: Basic Metabolic Panel: Recent Labs  Lab 09/14/17 0413  NA 132*  K 3.7  CL 101  CO2 24  GLUCOSE 138*  BUN 9  CREATININE 0.93  CALCIUM 8.6*   Liver Function Tests: Recent Labs  Lab 09/14/17 0413  AST 26  ALT 15  ALKPHOS 51  BILITOT 0.4  PROT 7.1  ALBUMIN 3.3*    CBC: Recent Labs  Lab 09/13/17 1536 09/14/17 0413 09/15/17 0420  WBC  --  9.3 7.0  NEUTROABS  --  7.8* 3.9  HGB 12.1 11.4* 10.6*  HCT 37.1 34.9* 32.4*  MCV  --  76.4* 77.1*  PLT  --  250 198    CBG: No results for input(s): GLUCAP in the last 168 hours.  Principal Problem:   Morbid obesity ([place]) Active Problems:   Bilateral chronic knee pain   Time coordinating discharge: 10 min  Signed:  Gayland Curry, MD [facility], [place] [phone] 09/20/2017, 10:25 AM

## 2017-09-27 ENCOUNTER — Telehealth: Payer: Self-pay | Admitting: Skilled Nursing Facility1

## 2017-09-27 ENCOUNTER — Encounter: Payer: BC Managed Care – PPO | Attending: General Surgery | Admitting: Skilled Nursing Facility1

## 2017-09-27 DIAGNOSIS — Z6841 Body Mass Index (BMI) 40.0 and over, adult: Secondary | ICD-10-CM | POA: Diagnosis not present

## 2017-09-27 DIAGNOSIS — Z713 Dietary counseling and surveillance: Secondary | ICD-10-CM | POA: Diagnosis present

## 2017-09-27 NOTE — Telephone Encounter (Signed)
Pt called to clarify the post op diet

## 2017-09-29 ENCOUNTER — Encounter: Payer: Self-pay | Admitting: Skilled Nursing Facility1

## 2017-09-29 NOTE — Progress Notes (Signed)
Bariatric Class:  Appt start time: 1530 end time:  1630.  2 Week Post-Operative Nutrition Class  Patient was seen on 09/29/2017 for Post-[facility]ative [facility]   Surgery date: 09/13/2017 Surgery type: sleeve Start weight at [facility]: 274.8 Weight today: pt arrived too late to have her wt taken  The following the learning objectives were met by the patient during this course:  Identifies Phase 3A (Soft, High Proteins) Dietary Goals and will begin from 2 weeks post-operatively to 2 months post-operatively  Identifies appropriate sources of fluids and proteins   States protein recommendations and appropriate sources post-operatively  Identifies the need for appropriate texture modifications, mastication, and bite sizes when consuming solids  Identifies appropriate multivitamin and calcium sources post-operatively  Describes the need for physical activity post-operatively and will follow MD recommendations  States when to call healthcare provider regarding medication questions or post-operative complications  Handouts given during class include:  Phase 3A: Soft, High Protein Diet Handout  Follow-Up Plan: Patient will follow-up at [facility] in 6 weeks for 2 month post-op nutrition visit for diet advancement per MD.

## 2017-09-30 ENCOUNTER — Telehealth: Payer: Self-pay | Admitting: Registered"

## 2017-09-30 NOTE — Telephone Encounter (Signed)
RD called pt to verify fluid intake. Pt reports ~76 oz (4 1/2 x 16.9 oz bottles) of water a day along with 1 protein shake. Pt states she has tried fish and beans; tolerated well.

## 2017-10-05 ENCOUNTER — Encounter (HOSPITAL_COMMUNITY): Payer: BC Managed Care – PPO

## 2017-10-06 ENCOUNTER — Other Ambulatory Visit: Payer: Self-pay | Admitting: General Surgery

## 2017-10-06 ENCOUNTER — Ambulatory Visit (HOSPITAL_COMMUNITY)
Admission: RE | Admit: 2017-10-06 | Discharge: 2017-10-06 | Disposition: A | Discharge status: Home / Self Care | Payer: BC Managed Care – PPO | Source: Ambulatory Visit | Attending: General Surgery | Admitting: General Surgery

## 2017-10-06 DIAGNOSIS — Z9884 Bariatric surgery status: Secondary | ICD-10-CM | POA: Insufficient documentation

## 2017-10-06 DIAGNOSIS — R110 Nausea: Secondary | ICD-10-CM | POA: Diagnosis not present

## 2017-10-06 MED ORDER — ONDANSETRON 4 MG PO TBDP: 4.0000 mg | ORAL_TABLET | ORAL | Status: DC | PRN

## 2017-10-06 MED ORDER — SODIUM CHLORIDE 0.9 % IV BOLUS (SEPSIS)
1000.0000 mL | Freq: Once | INTRAVENOUS | Status: AC
  Administered 2017-10-06: 1000 mL via INTRAVENOUS

## 2017-10-06 MED ORDER — ACETAMINOPHEN 325 MG PO TABS: 325.0000 mg | ORAL_TABLET | ORAL | Status: DC | PRN

## 2017-10-06 MED ORDER — ONDANSETRON HCL 4 MG/2ML IJ SOLN: 4.0000 mg | INTRAMUSCULAR | Status: DC | PRN

## 2017-10-06 MED ORDER — SODIUM CHLORIDE 0.9 % IV SOLN: INTRAVENOUS | Status: DC

## 2017-10-06 MED ORDER — ACETAMINOPHEN 325 MG RE SUPP
325.0000 mg | RECTAL | Status: DC | PRN
  Filled 2017-10-06: qty 2

## 2017-10-06 MED ORDER — ACETAMINOPHEN 160 MG/5ML PO SOLN
325.0000 mg | ORAL | Status: DC | PRN
  Filled 2017-10-06: qty 20.3

## 2017-10-06 MED ORDER — THIAMINE HCL 100 MG/ML IJ SOLN
INTRAVENOUS | Status: DC
  Administered 2017-10-06: 10:00:00 via INTRAVENOUS
  Filled 2017-10-06 (×4): qty 1000

## 2017-10-06 NOTE — Progress Notes (Signed)
Patient present for hydration post bariatric surgery.  Orders obtained from Dr. Gaynelle AduEric Wilson, patient given 1L Banana Bag hydration followed by 1L plain NS.  Tolerated well without complaint.  Ambulatory at time of discharge.

## 2017-10-07 ENCOUNTER — Encounter (HOSPITAL_COMMUNITY): Payer: BC Managed Care – PPO

## 2017-11-10 ENCOUNTER — Encounter: Payer: Self-pay | Admitting: Registered"

## 2017-11-10 ENCOUNTER — Encounter: Payer: BC Managed Care – PPO | Attending: General Surgery | Admitting: Registered"

## 2017-11-10 DIAGNOSIS — Z713 Dietary counseling and surveillance: Secondary | ICD-10-CM | POA: Diagnosis not present

## 2017-11-10 DIAGNOSIS — Z6841 Body Mass Index (BMI) 40.0 and over, adult: Secondary | ICD-10-CM | POA: Insufficient documentation

## 2017-11-10 DIAGNOSIS — E669 Obesity, unspecified: Secondary | ICD-10-CM

## 2017-11-10 NOTE — Progress Notes (Signed)
Follow-up visit:  8 Weeks Post-Operative Sleeve Gastrectomy Surgery  Medical Nutrition Therapy:  Appt start time: 2:05 end time:  2:45.  Primary concerns today: Post-operative Bariatric Surgery Nutrition Management.  Non scale victories: eats less food, increased energy, able to run more on treadmill, less tired when walking  Surgery date: 09/13/2017 Surgery type: sleeve [facility] Weight today: 234.6 Weight change: N/A due to pt not having weight taken at previous appt due to being late Total weight lost: 40.2 lbs Weight loss goal: none stated   TANITA  BODY COMP RESULTS  11/10/2017   BMI (kg/m^2) 42.2   Fat Mass (lbs) 99.2   Fat Free Mass (lbs) 135.4   Total Body Water (lbs) 99.0   Pt states she has bowel movements every other day taking Miralax. Pt states she has been doing well without any complications.   Preferred Learning Style:   No preference indicated   Learning Readiness:   Ready  Change in progress  24-hr recall: B (AM): 1/2 protein drink (21g) or egg whites  Snk (AM): 3 Malaw[place]key roll ups (21g) or greek yogurt (15g) or jello L (PM): 3 [place]key roll ups (21g) Snk (PM): greek yogurt (15g) or jello D (PM): [employer]- 3 oz grilled chicken (21g) Snk (PM): sugar-free popsicle  Fluid intake: water (32 oz), vitamin water zero (32 oz), protein drink; 64+ounces Estimated total protein intake: ~84 grams  Medications: See list Supplementation: Bariatric Advantage + 3 calcium  Using straws: no Drinking while eating: no Having you been chewing well: yes Chewing/swallowing difficulties: no Changes in vision: no Changes to mood/headaches: no Hair loss/Changes to skin/Changes to nails: no, no, no Any difficulty focusing or concentrating: no Sweating: no Dizziness/Lightheaded: no Palpitations: no  Carbonated beverages: no N/V/D/C/GAS: no, no, no, a little-Miralax, no Abdominal Pain: no Dumping syndrome: no Last Lap-Band fill: N/A  Recent physical  activity:  45 min Mon-Thurs of cardio/interval training & full body workout   Progress Towards Goal(s):  In progress.  Handouts given during visit include:  Phase IV: High Protein + NS vegetables   Nutritional Diagnosis:  [place]-3.3 Overweight/obesity related to past poor dietary habits and physical inactivity as evidenced by patient w/ recent sleeve gastrectomy surgery following dietary guidelines for continued weight loss.    Intervention:  Nutrition education and counseling. Pt was educated and counseled on Phase IV.  Goals:  Follow Phase 3B: High Protein + Non-Starchy Vegetables  Eat 3-6 small meals/snacks, every 3-5 hrs  Increase lean protein foods to meet 60g goal  Increase fluid intake to 64oz +  Avoid drinking 15 minutes before, during and 30 minutes after eating  Aim for >30 min of physical activity daily - Keep up the great work!  Teaching Method Utilized:  Visual Auditory Hands on  Barriers to learning/adherence to lifestyle change: none identified  Demonstrated degree of understanding via:  Teach Back   Monitoring/Evaluation:  Dietary intake, exercise, lap band fills, and body weight. Follow up in 4 months for 6 month post-op visit.

## 2017-11-10 NOTE — Patient Instructions (Addendum)
Goals:  Follow Phase 3B: High Protein + Non-Starchy Vegetables  Eat 3-6 small meals/snacks, every 3-5 hrs  Increase lean protein foods to meet 60g goal  Increase fluid intake to 64oz +  Avoid drinking 15 minutes before, during and 30 minutes after eating  Aim for >30 min of physical activity daily  - Keep up the great work!   

## 2018-03-13 ENCOUNTER — Ambulatory Visit: Payer: BC Managed Care – PPO | Admitting: Registered"

## 2018-03-16 ENCOUNTER — Encounter: Payer: Self-pay | Admitting: Registered"

## 2018-03-16 ENCOUNTER — Encounter: Payer: BC Managed Care – PPO | Attending: General Surgery | Admitting: Registered"

## 2018-03-16 DIAGNOSIS — Z713 Dietary counseling and surveillance: Secondary | ICD-10-CM | POA: Insufficient documentation

## 2018-03-16 DIAGNOSIS — E669 Obesity, unspecified: Secondary | ICD-10-CM

## 2018-03-16 NOTE — Progress Notes (Signed)
Follow-up visit: 6 Months Post-Operative Sleeve Gastrectomy Surgery  Medical Nutrition Therapy:  Appt start time: 2:55 end time: 3:25  Primary concerns today: Post-operative Bariatric Surgery Nutrition Management.  Non scale victories: eats less food, increased energy, able to run more on treadmill, less tired when walking, reduction in thyroid medication dosage  Surgery date: 09/13/2017 Surgery type: sleeve [facility] Weight today: 218.2 Weight change: 16.4 lbs loss from 234 lbs (11/10/2017) Total weight lost: 56.6 lbs Weight loss goal: prevent health conditions common in her family such as diabetes & cardiovascular disease, more energy, find a way to eat healthier  TANITA  BODY COMP RESULTS  11/10/2017 03/16/2018   BMI (kg/m^2) 42.2 39.3   Fat Mass (lbs) 99.2 96.4   Fat Free Mass (lbs) 135.4 121.8   Total Body Water (lbs) 99.0 88.8    Pt states apples and oranges make her sick. Pt states she likes protein shakes because they are quick and convenient. Pt states she was tired of drinking premier protein and changed to another protein shake (15g protein, 28 grams of carbohydrates per bottle). Pt states constipation has improved.    Preferred Learning Style:   No preference indicated   Learning Readiness:   Ready  Change in progress  24-hr recall: B (AM):  Snk (AM): nuts (7g) or 3 Malaw[place]key roll ups (21g) or greek yogurt (15g) or jello L (PM): 3 Malaw[place]key roll ups (21g) + vegetables Snk (PM): tuna pack (15g) or  nuts (7g)  D (PM): [employer] - 3 oz grilled chicken (21g) Snk (PM): sugar-free popsicle  Fluid intake: water with f (48 oz) protein drink; ~55 ounces Estimated total protein intake: 60+ grams  Medications: See list Supplementation: Bariatric Advantage + 3 calcium  Using straws: no Drinking while eating: no Having you been chewing well: yes Chewing/swallowing difficulties: no Changes in vision: no Changes to mood/headaches: sometimes Hair  loss/Changes to skin/Changes to nails: no, no, no Any difficulty focusing or concentrating: no Sweating: no Dizziness/Lightheaded: no Palpitations: no  Carbonated beverages: no N/V/D/C/GAS: no, no, no,no, no Abdominal Pain: no Dumping syndrome: no Last Lap-Band fill: N/A  Recent physical activity: [employer] 45 min Mon-Fri of cardio/interval training & full body workout   Progress Towards Goal(s):  In progress.  Handouts given during visit include:  Phase IV: High Protein + All vegetables   Nutritional Diagnosis:  [place]-3.3 Overweight/obesity related to past poor dietary habits and physical inactivity as evidenced by patient w/ recent sleeve gastrectomy surgery following dietary guidelines for continued weight loss.    Intervention:  Nutrition education and counseling. Pt was educated and counseled on Phase IV.  Goals:  Follow Phase IV: High Protein + All Vegetables  Eat 3-6 small meals/snacks, every 3-5 hrs  Increase lean protein foods to meet 60g goal  Increase fluid intake to 64oz +  Avoid drinking 15 minutes before, during and 30 minutes after eating  Aim for >30 min of physical activity daily  Try to replace shakes with food for breakfast.   Add in another bottle of water a day.   Teaching Method Utilized:  Visual Auditory Hands on  Barriers to learning/adherence to lifestyle change: none identified  Demonstrated degree of understanding via:  Teach Back   Monitoring/Evaluation:  Dietary intake, exercise, lap band fills, and body weight. Follow up in 3 months for 9 month post-op visit.

## 2018-03-16 NOTE — Patient Instructions (Addendum)
Goals:  Follow Phase IV: High Protein + All Starchy Vegetables  Eat 3-6 small meals/snacks, every 3-5 hrs  Increase lean protein foods to meet 60g goal  Increase fluid intake to 64oz +  Avoid drinking 15 minutes before, during and 30 minutes after eating  Aim for >30 min of physical activity daily  Try to replace shakes with food for breakfast.   Add in another bottle of water a day.

## 2018-06-19 ENCOUNTER — Ambulatory Visit: Payer: BC Managed Care – PPO | Admitting: Dietician

## 2018-07-03 ENCOUNTER — Ambulatory Visit: Payer: BC Managed Care – PPO | Admitting: Skilled Nursing Facility1

## 2018-08-24 ENCOUNTER — Encounter (HOSPITAL_COMMUNITY): Payer: Self-pay

## 2018-08-24 ENCOUNTER — Other Ambulatory Visit: Payer: Self-pay

## 2018-08-24 ENCOUNTER — Ambulatory Visit (HOSPITAL_COMMUNITY)
Admission: EM | Admit: 2018-08-24 | Discharge: 2018-08-24 | Disposition: A | Discharge status: Home / Self Care | Payer: BC Managed Care – PPO | Attending: Family Medicine | Admitting: Family Medicine

## 2018-08-24 DIAGNOSIS — Z041 Encounter for examination and observation following transport accident: Secondary | ICD-10-CM | POA: Diagnosis not present
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
## 2018-08-24 NOTE — Discharge Instructions (Addendum)
You may use over the counter ibuprofen or acetaminophen as needed.  ° °

## 2019-01-21 IMAGING — CR DG CHEST 2V
2 series · 2 of 2 positions shown · non-contrast
Comparison: None.

CLINICAL DATA: Preop for bariatric surgery.

EXAM:
CHEST  2 VIEW

[w chest pa]
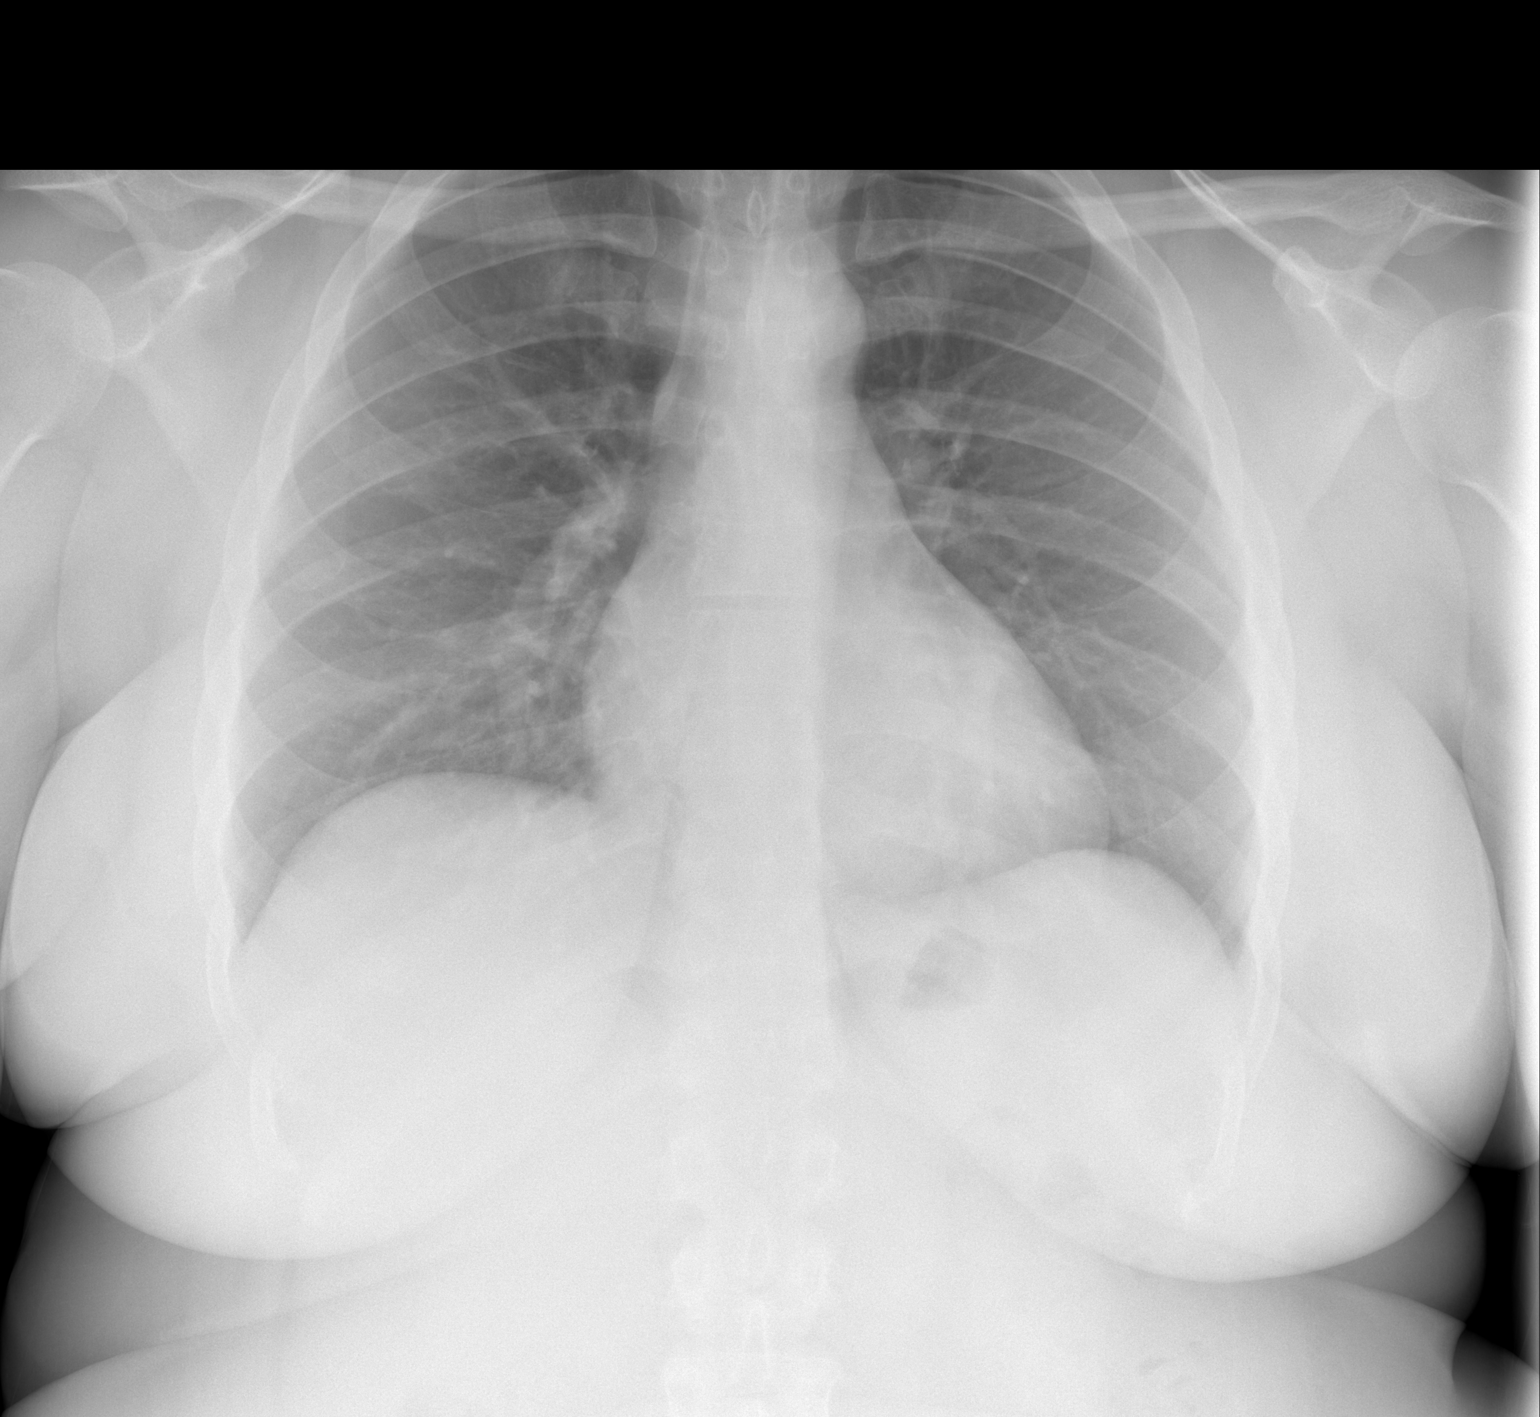

[w chest lat]
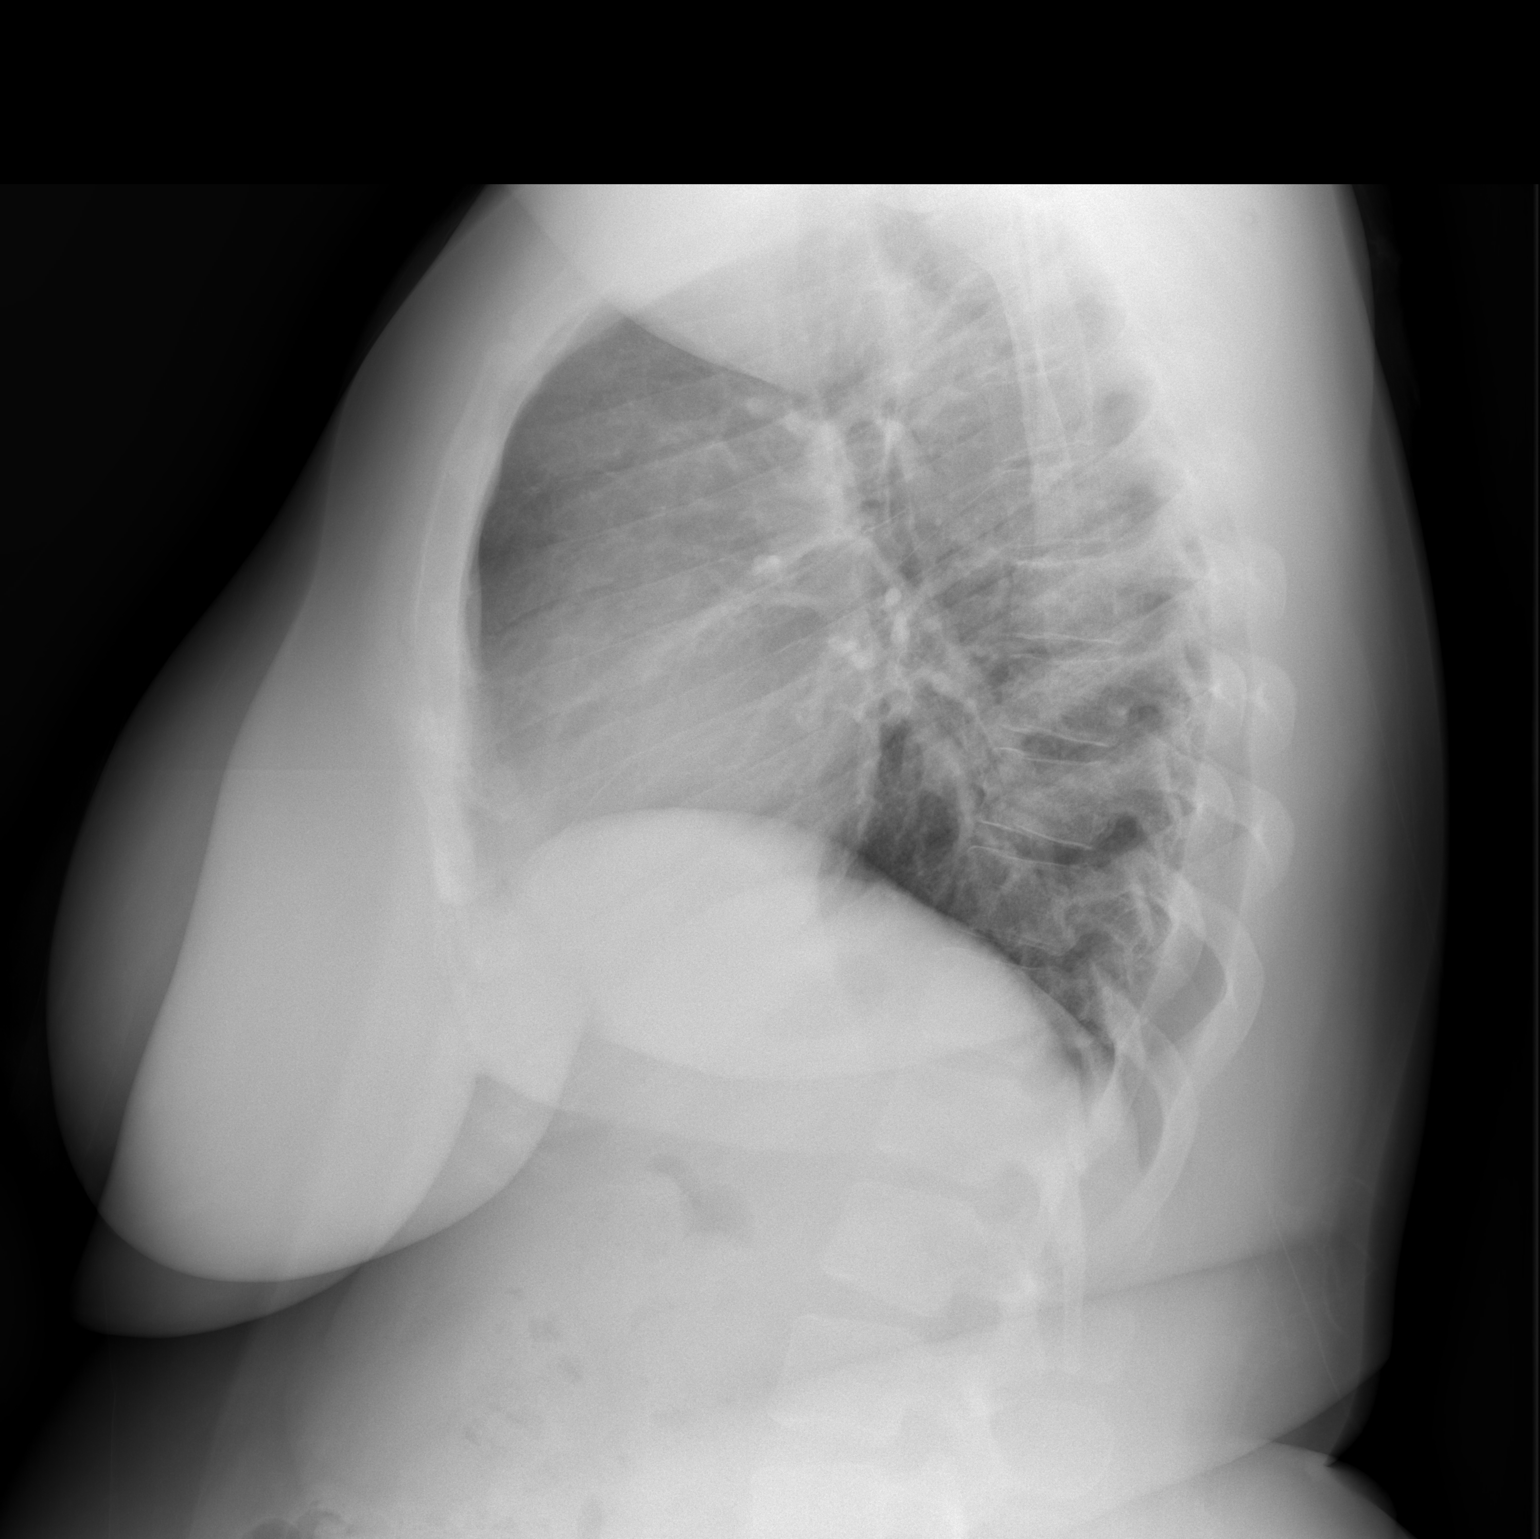

[2 of 2 positions shown; findings below may reference images not displayed]

FINDINGS: The heart size and mediastinal contours are within normal limits.
Both lungs are clear. No pleural effusion or pneumothorax. The
visualized skeletal structures are unremarkable.
IMPRESSION: No active cardiopulmonary disease.

## 2019-09-22 ENCOUNTER — Ambulatory Visit: Payer: BC Managed Care – PPO | Attending: Internal Medicine

## 2019-09-22 DIAGNOSIS — Z23 Encounter for immunization: Secondary | ICD-10-CM

## 2019-09-22 NOTE — Progress Notes (Signed)
   Covid-19 Vaccination Clinic  Name:  Lauren Oconnor    MRN: [id number] DOB: 13-Sep-1985  09/22/2019  [name] was observed post Covid-19 immunization for 15 minutes without incident. She was provided with Vaccine Information Sheet and instruction to access the V-Safe system.   [name] was instructed to call 911 with any severe reactions post vaccine: Marland Kitchen Difficulty breathing  . Swelling of face and throat  . A fast heartbeat  . A bad rash all over body  . Dizziness and weakness   Immunizations Administered    Name Date Dose VIS Date Route   Pfizer COVID-19 Vaccine 09/22/2019 10:05 AM 0.3 mL 06/29/2019 Intramuscular   Manufacturer: [employer], [employer]   Lot: [id number]   NDC: [address]-3

## 2019-10-13 ENCOUNTER — Ambulatory Visit: Payer: BC Managed Care – PPO | Attending: Internal Medicine

## 2019-10-13 DIAGNOSIS — Z23 Encounter for immunization: Secondary | ICD-10-CM

## 2019-10-13 NOTE — Progress Notes (Signed)
   Covid-19 Vaccination Clinic  Name:  Lauren Oconnor    MRN: [id number] DOB: 19-Mar-1986  10/13/2019  Lauren Oconnor was observed post Covid-19 immunization for 15 minutes without incident. She was provided with Vaccine Information Sheet and instruction to access the V-Safe system.   Lauren Oconnor was instructed to call 911 with any severe reactions post vaccine: Marland Kitchen Difficulty breathing  . Swelling of face and throat  . A fast heartbeat  . A bad rash all over body  . Dizziness and weakness   Immunizations Administered    Name Date Dose VIS Date Route   Pfizer COVID-19 Vaccine 10/13/2019 10:25 AM 0.3 mL 06/29/2019 Intramuscular   Manufacturer: [employer], [employer]   Lot: [id number]   NDC: [address]-0

## 2021-11-08 ENCOUNTER — Ambulatory Visit
Admission: EM | Admit: 2021-11-08 | Discharge: 2021-11-08 | Disposition: A | Discharge status: Home / Self Care | Payer: BC Managed Care – PPO | Attending: Emergency Medicine | Admitting: Emergency Medicine

## 2021-11-08 ENCOUNTER — Encounter: Payer: Self-pay | Admitting: Emergency Medicine

## 2021-11-08 DIAGNOSIS — M7918 Myalgia, other site: Secondary | ICD-10-CM | POA: Diagnosis not present

## 2021-11-08 MED ORDER — IBUPROFEN 800 MG PO TABS: 800.0000 mg | ORAL_TABLET | Freq: Three times a day (TID) | ORAL | 0 refills | Status: DC | PRN

## 2021-11-08 MED ORDER — CYCLOBENZAPRINE HCL 10 MG PO TABS: 10.0000 mg | ORAL_TABLET | Freq: Two times a day (BID) | ORAL | 0 refills | Status: DC | PRN
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
## 2021-11-08 NOTE — Discharge Instructions (Addendum)
Take the ibuprofen as needed for discomfort.  Take the muscle relaxer as needed for muscle spasm; Do not drive, operate machinery, or drink alcohol with this medication as it can cause drowsiness.   Follow up with your primary care provider or an orthopedist if your symptoms are not improving.     
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
## 2021-11-16 ENCOUNTER — Other Ambulatory Visit: Payer: Self-pay | Admitting: Internal Medicine

## 2021-11-16 ENCOUNTER — Ambulatory Visit
Admission: RE | Admit: 2021-11-16 | Discharge: 2021-11-16 | Disposition: A | Discharge status: Home / Self Care | Payer: BC Managed Care – PPO | Source: Ambulatory Visit | Attending: Internal Medicine | Admitting: Internal Medicine

## 2021-11-16 DIAGNOSIS — M25512 Pain in left shoulder: Secondary | ICD-10-CM

## 2022-10-22 ENCOUNTER — Other Ambulatory Visit: Payer: Self-pay | Admitting: Obstetrics and Gynecology

## 2022-12-31 ENCOUNTER — Other Ambulatory Visit: Payer: Self-pay | Admitting: Obstetrics and Gynecology

## 2023-03-24 ENCOUNTER — Encounter (HOSPITAL_COMMUNITY): Payer: Self-pay | Admitting: *Deleted

## 2023-05-03 ENCOUNTER — Other Ambulatory Visit: Payer: Self-pay | Admitting: Student

## 2023-05-03 DIAGNOSIS — R1310 Dysphagia, unspecified: Secondary | ICD-10-CM

## 2023-05-09 ENCOUNTER — Ambulatory Visit
Admission: RE | Admit: 2023-05-09 | Discharge: 2023-05-09 | Disposition: A | Discharge status: Home / Self Care | Payer: BC Managed Care – PPO | Source: Ambulatory Visit | Attending: Student | Admitting: Student

## 2023-05-09 DIAGNOSIS — R1310 Dysphagia, unspecified: Secondary | ICD-10-CM

## 2023-08-11 ENCOUNTER — Encounter: Payer: Self-pay | Admitting: Gastroenterology

## 2023-09-22 ENCOUNTER — Encounter: Payer: Self-pay | Admitting: Gastroenterology

## 2023-09-22 ENCOUNTER — Ambulatory Visit: Payer: Self-pay | Admitting: Gastroenterology

## 2023-10-10 ENCOUNTER — Encounter (HOSPITAL_COMMUNITY): Payer: Self-pay

## 2023-10-10 ENCOUNTER — Ambulatory Visit (HOSPITAL_COMMUNITY)
Admission: RE | Admit: 2023-10-10 | Discharge: 2023-10-10 | Disposition: A | Discharge status: Home / Self Care | Source: Ambulatory Visit | Attending: Family Medicine | Admitting: Family Medicine

## 2023-10-10 VITALS — BP 114/79 | HR 88 | Temp 97.6°F | Resp 16 | Ht 62.0 in | Wt 255.0 lb

## 2023-10-10 DIAGNOSIS — R091 Pleurisy: Secondary | ICD-10-CM | POA: Diagnosis not present

## 2023-10-10 MED ORDER — PREDNISONE 20 MG PO TABS: 40.0000 mg | ORAL_TABLET | Freq: Every day | ORAL | 0 refills | Status: AC

## 2023-10-10 MED ORDER — PANTOPRAZOLE SODIUM 40 MG PO TBEC: 40.0000 mg | DELAYED_RELEASE_TABLET | Freq: Every day | ORAL | 0 refills | Status: DC

## 2023-10-10 NOTE — ED Triage Notes (Signed)
 Patient here today with c/o lower left anterior rib pain off and on for 2 months now. Patient has a h/o hiatal hernia. Patient states that she had a gastric sleeve surgery in 2019 and the hernia was repaired at that time. The pain feels similar to when she had the hernia.

## 2023-10-10 NOTE — Discharge Instructions (Addendum)
 It was nice seeing you today. I am sorry about your pain. I believe this is what we call pleurisy which is the inflammation of the lining of your lungs. Ibuprofen could help, but with your reflux concern, we would hold off on that for now.  I have given your corticosteroids for this for 3 days. Monitor your home glucose closely. Also see you gastroenterologist as planned for your hernia.  I provided information about pleurisy with this summary.

## 2023-10-10 NOTE — ED Provider Notes (Signed)
 [facility]    CSN: [id number] Arrival date & time: 10/10/23  1711      History   Chief Complaint Chief Complaint  Patient presents with   Chest Pain    Rib    HPI Lauren Oconnor is a 38 y.o. female.   The history is provided by the patient. No language interpreter was used.  Chest Pain Pain location:  L chest (Left lateral chest pain/Rib pain) Pain quality: sharp, stabbing and throbbing   Pain severity:  Severe (Pain is about 8/10 in severity) Onset quality:  Gradual Duration:  4 months Timing:  Intermittent Progression:  Worsening Context: breathing and movement   Context: not lifting and not raising an arm   Context comment:  Gastric sleeve surgery 2019, and had a similar symptoms prior to her sleeve surgery. She stated this was an hiatal hernia that was repaired with the gastric sleeve surgery Relieved by:  Rest Worsened by:  Movement Ineffective treatments:  Antacids Associated symptoms: heartburn   Associated symptoms: no abdominal pain, no fatigue, no fever, no palpitations, no PND, no shortness of breath and no vomiting   Uses Pantoprazole for reflex which helps. She has an appointment with [facility] April 30th. LMP: 09/17/23  Past Medical History:  Diagnosis Date   Anemia    Anginal pain (HCC)    sometimes due to hiatal hernia    History of hiatal hernia    Hypothyroidism     Patient Active Problem List   Diagnosis Date Noted   Nausea 10/06/2017   Morbid obesity (HCC) 09/13/2017   Bilateral chronic knee pain 09/13/2017    Past Surgical History:  Procedure Laterality Date   LAPAROSCOPIC GASTRIC SLEEVE RESECTION N/A 09/13/2017   Procedure: LAPAROSCOPIC GASTRIC SLEEVE RESECTION WITH HIATAL HERNIA REPAIR AND UPPER ENDOSCOPY;  Surgeon: Gaynelle Adu, MD;  Location: [facility];  Service: General;  Laterality: N/A;   NO PAST SURGERIES      OB History   No obstetric history on file.      Home Medications    Prior to Admission medications    Medication Sig Start Date End Date Taking? Authorizing Provider  predniSONE (DELTASONE) 20 MG tablet Take 2 tablets (40 mg total) by mouth daily for 3 days. 10/10/23 10/13/23 Yes Doreene Eland, MD  Cholecalciferol (VITAMIN D3 PO) Take 1 capsule by mouth daily.    [provider]  IRON PO Take 1 tablet by mouth daily.    [provider]  levothyroxine (SYNTHROID, LEVOTHROID) 137 MCG tablet Take 137 mcg by mouth daily before breakfast.     [provider]  metFORMIN (GLUCOPHAGE-XR) 500 MG 24 hr tablet Take 1,000 mg by mouth at bedtime.    [provider]  Multiple Vitamins-Minerals (MULTIVITAMIN PO) Take 1 tablet by mouth daily.    [provider]  pantoprazole (PROTONIX) 40 MG tablet Take 1 tablet (40 mg total) by mouth daily. 10/10/23   Doreene Eland, MD    Family History Family History  Problem Relation Age of Onset   Cancer Other    Hypertension Other    Diabetes Other     Social History Social History   Tobacco Use   Smoking status: Never   Smokeless tobacco: Never  Vaping Use   Vaping status: Never Used  Substance Use Topics   Alcohol use: Yes   Drug use: No     Allergies   Patient has no known allergies.   Review of Systems Review of  Systems  Constitutional:  Negative for fatigue and fever.  Respiratory:  Negative for apnea and shortness of breath.   Cardiovascular:  Positive for chest pain. Negative for palpitations and PND.  Gastrointestinal:  Positive for heartburn. Negative for abdominal pain and vomiting.  All other systems reviewed and are negative.    Physical Exam Triage Vital Signs ED Triage Vitals  Encounter Vitals Group     BP 10/10/23 1732 114/79     Systolic BP Percentile --      Diastolic BP Percentile --      Pulse Rate 10/10/23 1732 88     Resp 10/10/23 1732 16     Temp 10/10/23 1732 97.6 F (36.4 C)     Temp Source 10/10/23 1732 Oral     SpO2 10/10/23 1732 97 %     Weight 10/10/23 1733  255 lb (115.7 kg)     Height 10/10/23 1733 5\' 2"  (1.575 m)     Head Circumference --      Peak Flow --      Pain Score 10/10/23 1734 9     Pain Loc --      Pain Education --      Exclude from Growth Chart --    No data found.  Updated Vital Signs BP 114/79 (BP Location: Left Arm)   Pulse 88   Temp 97.6 F (36.4 C) (Oral)   Resp 16   Ht 5\' 2"  (1.575 m)   Wt 115.7 kg   LMP 09/17/2023 (Exact Date)   SpO2 97%   BMI 46.64 kg/m   Visual Acuity Right Eye Distance:   Left Eye Distance:   Bilateral Distance:    Right Eye Near:   Left Eye Near:    Bilateral Near:     Physical Exam Vitals and nursing note reviewed.  Constitutional:      General: She is not in acute distress.    Appearance: Normal appearance. She is not ill-appearing or toxic-appearing.  Cardiovascular:     Rate and Rhythm: Normal rate and regular rhythm.     Heart sounds: Normal heart sounds. No murmur heard. Pulmonary:     Effort: Pulmonary effort is normal. No respiratory distress.     Breath sounds: Normal breath sounds. No stridor. No wheezing, rhonchi or rales.     Comments: Left lateral chest and left lower parasternal tenderness.  Negative epigastric tenderness Abdominal:     General: Abdomen is flat. Bowel sounds are normal. There is no distension.     Palpations: Abdomen is soft. There is no mass.     Tenderness: There is no abdominal tenderness. There is no guarding or rebound.     Hernia: No hernia is present.  Neurological:     Mental Status: She is alert.      UC Treatments / Results  Labs (all labs ordered are listed, but only abnormal results are displayed) Labs Reviewed - No data to display   EKG   Radiology No results found.  Procedures Procedures (including critical care time)  Medications Ordered in UC Medications - No data to display  Initial Impression / Assessment and Plan / UC Course  I have reviewed the triage vital signs and the nursing notes.  Pertinent labs  & imaging results that were available during my care of the patient were reviewed by me and considered in my medical decision making (see chart for details).  Clinical Course as of 10/10/23 1848  Mon Oct 10, 2023  1847  Atypical chest pain Likely pleurisy Unable to give NSAID due to  GERD Prednisone escribed F/U as needed [KE]  1847 Hiatal hernia per the patient Has GI f/u  F/U as planned with ED precautions She agreed with the plan [KE]  1848 GERD I refilled her Protonix F/U GI as planned [KE]    Clinical Course User Index [KE] Doreene Eland, MD    Final Clinical Impressions(s) / UC Diagnoses   Final diagnoses:  Pleurisy     Discharge Instructions      It was nice seeing you today. I am sorry about your pain. I believe this is what we call pleurisy which is the inflammation of the lining of your lungs. Ibuprofen could help, but with your reflux concern, we would hold off on that for now.  I have given your corticosteroids for this for 3 days. Monitor your home glucose closely. Also see you gastroenterologist as planned for your hernia.  I provided information about pleurisy with this summary.      ED Prescriptions     Medication Sig Dispense Auth. Provider   pantoprazole (PROTONIX) 40 MG tablet Take 1 tablet (40 mg total) by mouth daily. 30 tablet Janit Pagan T, MD   predniSONE (DELTASONE) 20 MG tablet Take 2 tablets (40 mg total) by mouth daily for 3 days. 6 tablet Doreene Eland, MD      PDMP not reviewed this encounter.   Doreene Eland, MD 10/10/23 [phone]

## 2023-11-14 NOTE — Progress Notes (Signed)
 Chief Complaint: Primary GI MD:  HPI:  *** is a  ***  who was referred to me by Arva Lathe,* for a complaint of *** .     Discussed the use of AI scribe software for clinical note transcription with the patient, who gave verbal consent to proceed.  History of Present Illness      PREVIOUS GI WORKUP     Past Medical History:  Diagnosis Date   Anemia    Anginal pain (HCC)    sometimes due to hiatal hernia    History of hiatal hernia    Hypothyroidism     Past Surgical History:  Procedure Laterality Date   LAPAROSCOPIC GASTRIC SLEEVE RESECTION N/A 09/13/2017   Procedure: LAPAROSCOPIC GASTRIC SLEEVE RESECTION WITH HIATAL HERNIA REPAIR AND UPPER ENDOSCOPY;  Surgeon: Aldean Hummingbird, MD;  Location: [facility];  Service: General;  Laterality: N/A;   NO PAST SURGERIES      Current Outpatient Medications  Medication Sig Dispense Refill   Cholecalciferol (VITAMIN D3 PO) Take 1 capsule by mouth daily.     IRON PO Take 1 tablet by mouth daily.     levothyroxine (SYNTHROID, LEVOTHROID) 137 MCG tablet Take 137 mcg by mouth daily before breakfast.      metFORMIN (GLUCOPHAGE-XR) 500 MG 24 hr tablet Take 1,000 mg by mouth at bedtime.     Multiple Vitamins-Minerals (MULTIVITAMIN PO) Take 1 tablet by mouth daily.     pantoprazole  (PROTONIX ) 40 MG tablet Take 1 tablet (40 mg total) by mouth daily. 30 tablet 0   No current facility-administered medications for this visit.    Allergies as of 11/16/2023   (No Known Allergies)    Family History  Problem Relation Age of Onset   Cancer Other    Hypertension Other    Diabetes Other     Social History   Socioeconomic History   Marital status: Single    Spouse name: Not on file   Number of children: Not on file   Years of education: Not on file   Highest education level: Not on file  Occupational History   Not on file  Tobacco Use   Smoking status: Never   Smokeless tobacco: Never  Vaping Use   Vaping status: Never  Used  Substance and Sexual Activity   Alcohol use: Yes   Drug use: No   Sexual activity: Not on file  Other Topics Concern   Not on file  Social History Narrative   Not on file   Social Drivers of Health   Financial Resource Strain: Not on file  Food Insecurity: No Food Insecurity (05/30/2017)   Hunger Vital Sign    Worried About Running Out of Food in the Last Year: Never true    Ran Out of Food in the Last Year: Never true  Transportation Needs: Not on file  Physical Activity: Not on file  Stress: Not on file  Social Connections: Not on file  Intimate Partner Violence: Not on file    Review of Systems:    Constitutional: No weight loss, fever, chills, weakness or fatigue HEENT: Eyes: No change in vision               Ears, Nose, Throat:  No change in hearing or congestion Skin: No rash or itching Cardiovascular: No chest pain, chest pressure or palpitations   Respiratory: No SOB or cough Gastrointestinal: See HPI and otherwise negative Genitourinary: No dysuria or change in urinary frequency Neurological: No headache, dizziness  or syncope Musculoskeletal: No new muscle or joint pain Hematologic: No bleeding or bruising Psychiatric: No history of depression or anxiety    Physical Exam:  Vital signs: There were no vitals taken for this visit.  Constitutional: NAD, alert and cooperative Head:  Normocephalic and atraumatic. Eyes:   PEERL, EOMI. No icterus. Conjunctiva pink. Respiratory: Respirations even and unlabored. Lungs clear to auscultation bilaterally.   No wheezes, crackles, or rhonchi.  Cardiovascular:  Regular rate and rhythm. No peripheral edema, cyanosis or pallor.  Gastrointestinal:  Soft, nondistended, nontender. No rebound or guarding. Normal bowel sounds. No appreciable masses or hepatomegaly. Rectal:  Declines Msk:  Symmetrical without gross deformities. Without edema, no deformity or joint abnormality.  Neurologic:  Alert and  oriented x4;  grossly  normal neurologically.  Skin:   Dry and intact without significant lesions or rashes. Psychiatric: Oriented to person, place and time. Demonstrates good judgement and reason without abnormal affect or behaviors.  Physical Exam    RELEVANT LABS AND IMAGING: CBC    Component Value Date/Time   WBC 7.0 09/15/2017 0420   RBC 4.20 09/15/2017 0420   HGB 10.6 (L) 09/15/2017 0420   HCT 32.4 (L) 09/15/2017 0420   PLT 198 09/15/2017 0420   MCV 77.1 (L) 09/15/2017 0420   MCH 25.2 (L) 09/15/2017 0420   MCHC 32.7 09/15/2017 0420   RDW 15.2 09/15/2017 0420   LYMPHSABS 2.7 09/15/2017 0420   MONOABS 0.5 09/15/2017 0420   EOSABS 0.0 09/15/2017 0420   BASOSABS 0.0 09/15/2017 0420    CMP     Component Value Date/Time   NA 132 (L) 09/14/2017 0413   K 3.7 09/14/2017 0413   CL 101 09/14/2017 0413   CO2 24 09/14/2017 0413   GLUCOSE 138 (H) 09/14/2017 0413   BUN 9 09/14/2017 0413   CREATININE 0.93 09/14/2017 0413   CALCIUM 8.6 (L) 09/14/2017 0413   PROT 7.1 09/14/2017 0413   ALBUMIN 3.3 (L) 09/14/2017 0413   AST 26 09/14/2017 0413   ALT 15 09/14/2017 0413   ALKPHOS 51 09/14/2017 0413   BILITOT 0.4 09/14/2017 0413   GFRNONAA >60 09/14/2017 0413   GFRAA >60 09/14/2017 0413     Assessment/Plan:   Assessment and Plan Assessment & Plan   Epigastric pain Upper GI symptoms with KUB October 2024 showed postsurgical changes from sleeve gastrectomy small recurrent sliding hiatal hernia, severe GERD moderate esophageal dysmotility with chronic reflux related dysmotility, no evidence of stricture, PUD, reflux esophagitis.  History of sleeve gastrectomy 2019    Suzanna Erp, PA-C [facility] 11/14/2023, 1:06 PM  Cc: Varadarajan, Rupashree,*

## 2023-11-16 ENCOUNTER — Ambulatory Visit: Payer: Self-pay | Admitting: Gastroenterology

## 2023-11-16 ENCOUNTER — Encounter: Payer: Self-pay | Admitting: Gastroenterology

## 2023-11-16 VITALS — BP 118/76 | HR 93 | Ht 66.0 in | Wt 244.0 lb

## 2023-11-16 DIAGNOSIS — R1013 Epigastric pain: Secondary | ICD-10-CM | POA: Diagnosis not present

## 2023-11-16 DIAGNOSIS — K449 Diaphragmatic hernia without obstruction or gangrene: Secondary | ICD-10-CM | POA: Diagnosis not present

## 2023-11-16 DIAGNOSIS — Z9884 Bariatric surgery status: Secondary | ICD-10-CM

## 2023-11-16 DIAGNOSIS — K219 Gastro-esophageal reflux disease without esophagitis: Secondary | ICD-10-CM

## 2023-11-16 DIAGNOSIS — R1012 Left upper quadrant pain: Secondary | ICD-10-CM | POA: Diagnosis not present

## 2023-11-16 MED ORDER — PANTOPRAZOLE SODIUM 40 MG PO TBEC: 40.0000 mg | DELAYED_RELEASE_TABLET | Freq: Two times a day (BID) | ORAL | 3 refills | Status: DC

## 2023-11-16 NOTE — Progress Notes (Signed)
 Agree with assessment and plan as outlined.

## 2023-11-16 NOTE — Patient Instructions (Signed)
 We have sent the following medications to your pharmacy for you to pick up at your convenience: Pantoprazole   You have been scheduled for an endoscopy. Please follow written instructions given to you at your visit today.  If you use inhalers (even only as needed), please bring them with you on the day of your procedure.  If you take any of the following medications, they will need to be adjusted prior to your procedure:   DO NOT TAKE 7 DAYS PRIOR TO TEST- Trulicity (dulaglutide) Ozempic, Wegovy (semaglutide) Mounjaro (tirzepatide) Bydureon Bcise (exanatide extended release)  DO NOT TAKE 1 DAY PRIOR TO YOUR TEST Rybelsus (semaglutide) Adlyxin (lixisenatide) Victoza (liraglutide) Byetta (exanatide) ___________________________________________________________________________  Due to recent changes in healthcare laws, you may see the results of your imaging and laboratory studies on MyChart before your provider has had a chance to review them.  We understand that in some cases there may be results that are confusing or concerning to you. Not all laboratory results come back in the same time frame and the provider may be waiting for multiple results in order to interpret others.  Please give [place] 48 hours in order for your provider to thoroughly review all the results before contacting the office for clarification of your results.    I appreciate the  opportunity to care for you  Thank You   Bayley [facility]

## 2023-11-16 NOTE — Addendum Note (Signed)
 Addended by: Chrisandra Counts on: 11/16/2023 10:03 AM   Modules accepted: Orders

## 2023-11-22 ENCOUNTER — Encounter: Admitting: Gastroenterology

## 2023-12-22 ENCOUNTER — Encounter: Payer: Self-pay | Admitting: Gastroenterology

## 2023-12-30 ENCOUNTER — Ambulatory Visit (AMBULATORY_SURGERY_CENTER): Admitting: Gastroenterology

## 2023-12-30 ENCOUNTER — Encounter: Payer: Self-pay | Admitting: Gastroenterology

## 2023-12-30 VITALS — BP 123/78 | HR 84 | Temp 97.5°F | Resp 14 | Ht 66.0 in | Wt 244.0 lb

## 2023-12-30 DIAGNOSIS — K2950 Unspecified chronic gastritis without bleeding: Secondary | ICD-10-CM | POA: Diagnosis not present

## 2023-12-30 DIAGNOSIS — R1013 Epigastric pain: Secondary | ICD-10-CM

## 2023-12-30 DIAGNOSIS — K449 Diaphragmatic hernia without obstruction or gangrene: Secondary | ICD-10-CM

## 2023-12-30 MED ORDER — SODIUM CHLORIDE 0.9 % IV SOLN: 500.0000 mL | Freq: Once | INTRAVENOUS | Status: AC

## 2023-12-30 NOTE — Op Note (Signed)
  [facility] Patient Name: Lauren Oconnor Procedure Date: 12/30/2023 2:04 PM MRN: [id number] Endoscopist: Geralyn Knee E. Cherryl Corona , MD, [id number] Age: 38 Referring MD:  Date of Birth: [birth year] Gender: Female Account #: [id number] Procedure:                Upper GI endoscopy Indications:              Epigastric abdominal pain Medicines:                Monitored Anesthesia Care Procedure:                Pre-Anesthesia Assessment:                           - Prior to the procedure, a History and Physical                            was performed, and patient medications and                            allergies were reviewed. The patient's tolerance of                            previous anesthesia was also reviewed. The risks                            and benefits of the procedure and the sedation                            options and risks were discussed with the patient.                            All questions were answered, and informed consent                            was obtained. Prior Anticoagulants: The patient has                            taken no anticoagulant or antiplatelet agents. ASA                            Grade Assessment: II - A patient with mild systemic                            disease. After reviewing the risks and benefits,                            the patient was deemed in satisfactory condition to                            undergo the procedure.                           After obtaining informed consent, the endoscope was  passed under direct vision. Throughout the                            procedure, the patient's blood pressure, pulse, and                            oxygen saturations were monitored continuously. The                            GIF [id number] #[id number] was introduced through the                            mouth, and advanced to the second part of duodenum.                            The upper GI endoscopy  was accomplished without                            difficulty. The patient tolerated the procedure                            well. Scope In: Scope Out: Findings:                 The examined portions of the nasopharynx,                            oropharynx and larynx were normal.                           The examined esophagus was normal.                           A 2 cm hiatal hernia was present.                           Evidence of a sleeve gastrectomy was found in the                            gastric body. This was characterized by healthy                            appearing mucosa.                           Normal mucosa was found in the stomach. Biopsies                            were taken with a cold forceps for Helicobacter                            pylori testing. Estimated blood loss was minimal.                           The examined duodenum was normal. Complications:  No immediate complications. Estimated Blood Loss:     Estimated blood loss was minimal. Impression:               - The examined portions of the nasopharynx,                            oropharynx and larynx were normal.                           - Normal esophagus.                           - 2 cm hiatal hernia.                           - A sleeve gastrectomy was found, characterized by                            healthy appearing mucosa.                           - Normal mucosa was found in the stomach. Biopsied.                           - Normal examined duodenum.                           - No endoscopic abnormalities to explain patient's                            abdominal pain. Consider musculoskeletal causes. Recommendation:           - Patient has a contact number available for                            emergencies. The signs and symptoms of potential                            delayed complications were discussed with the                            patient. Return to normal  activities tomorrow.                            Written discharge instructions were provided to the                            patient.                           - Resume previous diet.                           - Continue present medications.                           - Await pathology results. Prather Failla E. Cherryl Corona, MD 12/30/2023  2:23:12 PM This report has been signed electronically.

## 2023-12-30 NOTE — Progress Notes (Signed)
 Pt's states no medical or surgical changes since previsit or office visit.

## 2023-12-30 NOTE — Patient Instructions (Signed)

## 2023-12-30 NOTE — Progress Notes (Signed)
 [facility] History and Physical   Primary Care Physician:  Arva Lathe, MD   Reason for Procedure:   Epigastric pain  Plan:    EGD     HPI: Lauren Oconnor is a 38 y.o. female undergoing EGD to evaluate persistent epigastric pain since Feb 2024.  She had a sleeve gastrectomy and hiatal hernia repair in 2019.Aaron Aas  Her GERD symptoms are well controlled with pantoprazole  once daily.  Of note, the patient took her phentermine 7 days ago.  Our endoscopy policy recommends discontinuing the medication 10 days prior.  We discussed the increased anesthesia risks with the patient, and she indicated understanding but wished to proceed.  Our anesthesia provider was in agreement as well.    Past Medical History:  Diagnosis Date   Anemia    Anginal pain (HCC)    sometimes due to hiatal hernia    History of hiatal hernia    Hypothyroidism    Thyroid disease     Past Surgical History:  Procedure Laterality Date   LAPAROSCOPIC GASTRIC SLEEVE RESECTION N/A 09/13/2017   Procedure: LAPAROSCOPIC GASTRIC SLEEVE RESECTION WITH HIATAL HERNIA REPAIR AND UPPER ENDOSCOPY;  Surgeon: Aldean Hummingbird, MD;  Location: [facility];  Service: General;  Laterality: N/A;   NO PAST SURGERIES      Prior to Admission medications   Medication Sig Start Date End Date Taking? Authorizing Provider  IRON PO Take 1 tablet by mouth daily.   Yes [provider]  levothyroxine (SYNTHROID, LEVOTHROID) 137 MCG tablet Take 137 mcg by mouth daily before breakfast.    Yes [provider]  metFORMIN (GLUCOPHAGE-XR) 500 MG 24 hr tablet Take 1,000 mg by mouth at bedtime.   Yes [provider]  Multiple Vitamins-Minerals (MULTIVITAMIN PO) Take 1 tablet by mouth daily.   Yes [provider]  pantoprazole  (PROTONIX ) 40 MG tablet Take 1 tablet (40 mg total) by mouth 2 (two) times daily before a meal. 11/16/23  Yes McMichael, Bayley M, PA-C    Current Outpatient Medications  Medication  Sig Dispense Refill   IRON PO Take 1 tablet by mouth daily.     levothyroxine (SYNTHROID, LEVOTHROID) 137 MCG tablet Take 137 mcg by mouth daily before breakfast.      metFORMIN (GLUCOPHAGE-XR) 500 MG 24 hr tablet Take 1,000 mg by mouth at bedtime.     Multiple Vitamins-Minerals (MULTIVITAMIN PO) Take 1 tablet by mouth daily.     pantoprazole  (PROTONIX ) 40 MG tablet Take 1 tablet (40 mg total) by mouth 2 (two) times daily before a meal. 60 tablet 3   Current Facility-Administered Medications  Medication Dose Route Frequency Provider Last Rate Last Admin   0.9 %  sodium chloride  infusion  500 mL Intravenous Once Elois Hair, MD        Allergies as of 12/30/2023   (No Known Allergies)    Family History  Problem Relation Age of Onset   Kidney disease Mother    Liver disease Other    Colon cancer Other    Esophageal cancer Other     Social History   Socioeconomic History   Marital status: Single    Spouse name: Not on file   Number of children: Not on file   Years of education: Not on file   Highest education level: Not on file  Occupational History   Not on file  Tobacco Use   Smoking status: Never   Smokeless tobacco: Never  Vaping Use   Vaping status: Never Used  Substance and Sexual Activity   Alcohol use: Yes   Drug use: No   Sexual activity: Not on file  Other Topics Concern   Not on file  Social History Narrative   Not on file   Social Drivers of Health   Financial Resource Strain: Not on file  Food Insecurity: No Food Insecurity (05/30/2017)   Hunger Vital Sign    Worried About Running Out of Food in the Last Year: Never true    Ran Out of Food in the Last Year: Never true  Transportation Needs: Not on file  Physical Activity: Not on file  Stress: Not on file  Social Connections: Not on file  Intimate Partner Violence: Not on file    Review of Systems:  All other review of systems negative except as mentioned in the HPI.  Physical  Exam: Vital signs BP (!) 108/49   Pulse 85   Temp (!) 97.5 F (36.4 C)   Ht 5' 6 (1.676 m)   Wt 244 lb (110.7 kg)   SpO2 100%   BMI 39.38 kg/m   General:   Alert,  Well-developed, well-nourished, pleasant and cooperative in NAD Airway:  Mallampati 2 Lungs:  Clear throughout to auscultation.   Heart:  Regular rate and rhythm; no murmurs, clicks, rubs,  or gallops. Abdomen:  Soft, nontender and nondistended. Normal bowel sounds.   Neuro/Psych:  Normal mood and affect. A and O x 3   Jaiceon Collister E. Cherryl Corona, MD [facility]

## 2023-12-30 NOTE — Progress Notes (Signed)
 Called to room to assist during endoscopic procedure.  Patient ID and intended procedure confirmed with present staff. Received instructions for my participation in the procedure from the performing physician.

## 2024-01-02 ENCOUNTER — Telehealth: Payer: Self-pay | Admitting: *Deleted

## 2024-01-02 NOTE — Telephone Encounter (Signed)
 Attempted post procedure follow up call.  No answer - LVM.

## 2024-01-04 LAB — SURGICAL PATHOLOGY

## 2024-01-08 ENCOUNTER — Ambulatory Visit: Payer: Self-pay | Admitting: Gastroenterology

## 2024-01-08 NOTE — Progress Notes (Signed)
 Lauren Oconnor,  The biopsies taken from your stomach were notable for mild chronic gastritis (inflammation) which is a common finding, but there was no evidence of Helicobacter pylori infection. This common finding is not likely to explain abdominal pain and there is no specific treatment or further evaluation recommended.

## 2024-05-01 MED ORDER — PANTOPRAZOLE SODIUM 40 MG PO TBEC: 40.0000 mg | DELAYED_RELEASE_TABLET | Freq: Two times a day (BID) | ORAL | 3 refills | Status: AC
